# Patient Record
Sex: Female | Born: 1935 | Race: Black or African American | Hispanic: No | Marital: Single | Smoking: Never smoker
Health system: Southern US, Community
[De-identification: ages and names within clinical notes are randomized; demographics above are authoritative.]

## PROBLEM LIST (undated history)

## (undated) DIAGNOSIS — N183 Chronic kidney disease, stage 3 unspecified: Secondary | ICD-10-CM

## (undated) DIAGNOSIS — F0390 Unspecified dementia without behavioral disturbance: Secondary | ICD-10-CM

## (undated) DIAGNOSIS — F1010 Alcohol abuse, uncomplicated: Secondary | ICD-10-CM

## (undated) DIAGNOSIS — H9190 Unspecified hearing loss, unspecified ear: Secondary | ICD-10-CM

## (undated) DIAGNOSIS — E785 Hyperlipidemia, unspecified: Secondary | ICD-10-CM

## (undated) DIAGNOSIS — S32000A Wedge compression fracture of unspecified lumbar vertebra, initial encounter for closed fracture: Secondary | ICD-10-CM

## (undated) DIAGNOSIS — F09 Unspecified mental disorder due to known physiological condition: Secondary | ICD-10-CM

## (undated) DIAGNOSIS — I639 Cerebral infarction, unspecified: Secondary | ICD-10-CM

## (undated) DIAGNOSIS — W19XXXA Unspecified fall, initial encounter: Secondary | ICD-10-CM

## (undated) HISTORY — PX: FEMUR FRACTURE SURGERY: SHX633

## (undated) HISTORY — PX: FRACTURE SURGERY: SHX138

## (undated) HISTORY — PX: OTHER SURGICAL HISTORY: SHX169

---

## 2001-02-23 ENCOUNTER — Other Ambulatory Visit: Admission: RE | Admit: 2001-02-23 | Discharge: 2001-02-23 | Payer: Self-pay | Admitting: Family Medicine

## 2001-03-02 ENCOUNTER — Ambulatory Visit (HOSPITAL_COMMUNITY): Admission: RE | Admit: 2001-03-02 | Discharge: 2001-03-02 | Payer: Self-pay | Admitting: Family Medicine

## 2001-03-02 ENCOUNTER — Encounter: Payer: Self-pay | Admitting: Family Medicine

## 2002-03-16 ENCOUNTER — Encounter: Payer: Self-pay | Admitting: Family Medicine

## 2002-03-16 ENCOUNTER — Ambulatory Visit (HOSPITAL_COMMUNITY): Admission: RE | Admit: 2002-03-16 | Discharge: 2002-03-16 | Payer: Self-pay | Admitting: Family Medicine

## 2003-04-05 ENCOUNTER — Ambulatory Visit (HOSPITAL_COMMUNITY): Admission: RE | Admit: 2003-04-05 | Discharge: 2003-04-05 | Payer: Self-pay | Admitting: Family Medicine

## 2004-12-11 ENCOUNTER — Ambulatory Visit (HOSPITAL_COMMUNITY): Admission: RE | Admit: 2004-12-11 | Discharge: 2004-12-11 | Payer: Self-pay | Admitting: Family Medicine

## 2005-12-16 ENCOUNTER — Ambulatory Visit (HOSPITAL_COMMUNITY): Admission: RE | Admit: 2005-12-16 | Discharge: 2005-12-16 | Payer: Self-pay | Admitting: Family Medicine

## 2006-12-31 ENCOUNTER — Ambulatory Visit (HOSPITAL_COMMUNITY): Admission: RE | Admit: 2006-12-31 | Discharge: 2006-12-31 | Payer: Self-pay | Admitting: Family Medicine

## 2009-12-14 ENCOUNTER — Emergency Department (HOSPITAL_COMMUNITY): Admission: EM | Admit: 2009-12-14 | Discharge: 2009-12-14 | Payer: Self-pay | Admitting: Emergency Medicine

## 2010-05-12 ENCOUNTER — Encounter: Payer: Self-pay | Admitting: Family Medicine

## 2010-05-13 ENCOUNTER — Encounter: Payer: Self-pay | Admitting: Family Medicine

## 2010-07-05 LAB — COMPREHENSIVE METABOLIC PANEL
Alkaline Phosphatase: 72 U/L (ref 39–117)
BUN: 24 mg/dL — ABNORMAL HIGH (ref 6–23)
CO2: 18 mEq/L — ABNORMAL LOW (ref 19–32)
Chloride: 110 mEq/L (ref 96–112)
Creatinine, Ser: 1.58 mg/dL — ABNORMAL HIGH (ref 0.4–1.2)
GFR calc non Af Amer: 31 mL/min — ABNORMAL LOW (ref >60)
Glucose, Bld: 115 mg/dL — ABNORMAL HIGH (ref 70–99)
Potassium: 5 mEq/L (ref 3.5–5.1)
Total Bilirubin: 0.5 mg/dL (ref 0.3–1.2)

## 2010-07-05 LAB — URINALYSIS, ROUTINE W REFLEX MICROSCOPIC
Bilirubin Urine: NEGATIVE
Ketones, ur: NEGATIVE mg/dL
Protein, ur: 100 mg/dL — AB
Urobilinogen, UA: 0.2 mg/dL (ref 0.0–1.0)

## 2010-07-05 LAB — URINE MICROSCOPIC-ADD ON

## 2010-07-05 LAB — CBC
HCT: 33.7 % — ABNORMAL LOW (ref 36.0–46.0)
Hemoglobin: 11.3 g/dL — ABNORMAL LOW (ref 12.0–15.0)
MCH: 29.8 pg (ref 26.0–34.0)
MCV: 88.8 fL (ref 78.0–100.0)
RBC: 3.8 MIL/uL — ABNORMAL LOW (ref 3.87–5.11)

## 2010-07-05 LAB — URINE CULTURE: Colony Count: 35000

## 2010-07-05 LAB — DIFFERENTIAL
Basophils Absolute: 0 10*3/uL (ref 0.0–0.1)
Basophils Relative: 0 % (ref 0–1)
Lymphocytes Relative: 5 % — ABNORMAL LOW (ref 12–46)
Neutro Abs: 9.4 10*3/uL — ABNORMAL HIGH (ref 1.7–7.7)

## 2010-07-05 LAB — PROTIME-INR: Prothrombin Time: 14.8 seconds (ref 11.6–15.2)

## 2010-07-05 LAB — APTT: aPTT: 28 seconds (ref 24–37)

## 2013-08-29 ENCOUNTER — Emergency Department (HOSPITAL_COMMUNITY): Payer: Medicare Other

## 2013-08-29 ENCOUNTER — Inpatient Hospital Stay (HOSPITAL_COMMUNITY)
Admission: EM | Admit: 2013-08-29 | Discharge: 2013-09-01 | DRG: 064 | Disposition: A | Discharge status: Home / Self Care | Payer: Medicare Other | Attending: Family Medicine | Admitting: Family Medicine

## 2013-08-29 ENCOUNTER — Encounter (HOSPITAL_COMMUNITY): Payer: Self-pay | Admitting: Emergency Medicine

## 2013-08-29 DIAGNOSIS — S0003XA Contusion of scalp, initial encounter: Secondary | ICD-10-CM | POA: Diagnosis present

## 2013-08-29 DIAGNOSIS — I6350 Cerebral infarction due to unspecified occlusion or stenosis of unspecified cerebral artery: Principal | ICD-10-CM

## 2013-08-29 DIAGNOSIS — N183 Chronic kidney disease, stage 3 unspecified: Secondary | ICD-10-CM | POA: Diagnosis present

## 2013-08-29 DIAGNOSIS — I639 Cerebral infarction, unspecified: Secondary | ICD-10-CM | POA: Diagnosis present

## 2013-08-29 DIAGNOSIS — Z9181 History of falling: Secondary | ICD-10-CM

## 2013-08-29 DIAGNOSIS — S0083XA Contusion of other part of head, initial encounter: Secondary | ICD-10-CM | POA: Diagnosis present

## 2013-08-29 DIAGNOSIS — S1093XA Contusion of unspecified part of neck, initial encounter: Secondary | ICD-10-CM

## 2013-08-29 DIAGNOSIS — Y92009 Unspecified place in unspecified non-institutional (private) residence as the place of occurrence of the external cause: Secondary | ICD-10-CM

## 2013-08-29 DIAGNOSIS — W06XXXA Fall from bed, initial encounter: Secondary | ICD-10-CM | POA: Diagnosis present

## 2013-08-29 DIAGNOSIS — H9190 Unspecified hearing loss, unspecified ear: Secondary | ICD-10-CM | POA: Diagnosis present

## 2013-08-29 DIAGNOSIS — E785 Hyperlipidemia, unspecified: Secondary | ICD-10-CM | POA: Diagnosis present

## 2013-08-29 DIAGNOSIS — F0280 Dementia in other diseases classified elsewhere without behavioral disturbance: Secondary | ICD-10-CM | POA: Diagnosis present

## 2013-08-29 DIAGNOSIS — F079 Unspecified personality and behavioral disorder due to known physiological condition: Secondary | ICD-10-CM | POA: Diagnosis present

## 2013-08-29 DIAGNOSIS — E43 Unspecified severe protein-calorie malnutrition: Secondary | ICD-10-CM | POA: Diagnosis present

## 2013-08-29 DIAGNOSIS — G309 Alzheimer's disease, unspecified: Secondary | ICD-10-CM | POA: Diagnosis present

## 2013-08-29 DIAGNOSIS — Z681 Body mass index (BMI) 19 or less, adult: Secondary | ICD-10-CM

## 2013-08-29 DIAGNOSIS — I129 Hypertensive chronic kidney disease with stage 1 through stage 4 chronic kidney disease, or unspecified chronic kidney disease: Secondary | ICD-10-CM | POA: Diagnosis present

## 2013-08-29 HISTORY — DX: Unspecified hearing loss, unspecified ear: H91.90

## 2013-08-29 HISTORY — DX: Unspecified mental disorder due to known physiological condition: F09

## 2013-08-29 LAB — CBC WITH DIFFERENTIAL/PLATELET
BASOS PCT: 0 % (ref 0–1)
Basophils Absolute: 0 10*3/uL (ref 0.0–0.1)
EOS ABS: 0 10*3/uL (ref 0.0–0.7)
Eosinophils Relative: 0 % (ref 0–5)
HEMATOCRIT: 30.2 % — AB (ref 36.0–46.0)
HEMOGLOBIN: 10 g/dL — AB (ref 12.0–15.0)
Lymphocytes Relative: 13 % (ref 12–46)
Lymphs Abs: 0.8 10*3/uL (ref 0.7–4.0)
MCH: 29.9 pg (ref 26.0–34.0)
MCHC: 33.1 g/dL (ref 30.0–36.0)
MCV: 90.4 fL (ref 78.0–100.0)
MONO ABS: 0.5 10*3/uL (ref 0.1–1.0)
MONOS PCT: 9 % (ref 3–12)
NEUTROS PCT: 78 % — AB (ref 43–77)
Neutro Abs: 4.7 10*3/uL (ref 1.7–7.7)
Platelets: 148 10*3/uL — ABNORMAL LOW (ref 150–400)
RBC: 3.34 MIL/uL — ABNORMAL LOW (ref 3.87–5.11)
RDW: 14.6 % (ref 11.5–15.5)
WBC: 6 10*3/uL (ref 4.0–10.5)

## 2013-08-29 LAB — BASIC METABOLIC PANEL
BUN: 25 mg/dL — AB (ref 6–23)
CALCIUM: 8.6 mg/dL (ref 8.4–10.5)
CO2: 22 mEq/L (ref 19–32)
CREATININE: 1.38 mg/dL — AB (ref 0.50–1.10)
Chloride: 108 mEq/L (ref 96–112)
GFR, EST AFRICAN AMERICAN: 36 mL/min — AB (ref >90)
GFR, EST NON AFRICAN AMERICAN: 31 mL/min — AB (ref >90)
Glucose, Bld: 118 mg/dL — ABNORMAL HIGH (ref 70–99)
Potassium: 4.9 mEq/L (ref 3.7–5.3)
Sodium: 142 mEq/L (ref 137–147)

## 2013-08-29 MED ORDER — ASPIRIN 300 MG RE SUPP
300.0000 mg | Freq: Every day | RECTAL | Status: DC
  Filled 2013-08-29 (×5): qty 1

## 2013-08-29 MED ORDER — THIORIDAZINE HCL 10 MG PO TABS
10.0000 mg | ORAL_TABLET | Freq: Two times a day (BID) | ORAL | Status: DC
  Administered 2013-08-30 – 2013-09-01 (×6): 10 mg via ORAL
  Filled 2013-08-29 (×8): qty 1

## 2013-08-29 MED ORDER — THIORIDAZINE HCL 10 MG PO TABS
ORAL_TABLET | ORAL | Status: AC
  Filled 2013-08-29: qty 1

## 2013-08-29 MED ORDER — ENOXAPARIN SODIUM 40 MG/0.4ML ~~LOC~~ SOLN
40.0000 mg | SUBCUTANEOUS | Status: DC
  Administered 2013-08-30 – 2013-08-31 (×2): 40 mg via SUBCUTANEOUS
  Filled 2013-08-29 (×3): qty 0.4

## 2013-08-29 MED ORDER — SENNOSIDES-DOCUSATE SODIUM 8.6-50 MG PO TABS
1.0000 | ORAL_TABLET | Freq: Every evening | ORAL | Status: DC | PRN
  Filled 2013-08-29: qty 1

## 2013-08-29 MED ORDER — ASPIRIN 325 MG PO TABS
325.0000 mg | ORAL_TABLET | Freq: Every day | ORAL | Status: DC
  Administered 2013-08-30 – 2013-09-01 (×4): 325 mg via ORAL
  Filled 2013-08-29 (×4): qty 1

## 2013-08-29 MED ORDER — SIMVASTATIN 20 MG PO TABS
20.0000 mg | ORAL_TABLET | Freq: Every evening | ORAL | Status: DC
  Administered 2013-08-30 – 2013-08-31 (×3): 20 mg via ORAL
  Filled 2013-08-29 (×5): qty 1

## 2013-08-29 NOTE — ED Provider Notes (Signed)
CSN: [id number]     Arrival date & time 08/29/13  1445 History   First MD Initiated Contact with Patient 08/29/13 1516     Chief Complaint  Patient presents with  . Fall     (Consider location/radiation/quality/duration/timing/severity/associated sxs/prior Treatment) HPI Comments: Brought to the ER for evaluation secondary to fall. Patient reportedly fell out of bed this morning. She is noted to have some bruising on the left side of her face. Able to get her up and she did ambulate after the fall. Because of this, she is now brought in for evaluation. She went for a nap this afternoon, however, and upon waking up seems to be having difficulty bearing weight on her left side. Upon arrival, patient does say she has some pain going down her left leg, but cannot pinpoint it. She cannot provide much more information than that. Level V Caveat due to dementia.  Patient is a 80 y.o. female presenting with fall.  Fall    Past Medical History  Diagnosis Date  . Organic brain syndrome   . Hard of hearing    Past Surgical History  Procedure Laterality Date  . Chronic alcohol abuse    . Fracture surgery    . Femur fracture surgery     History reviewed. No pertinent family history. History  Substance Use Topics  . Smoking status: Never Smoker   . Smokeless tobacco: Not on file  . Alcohol Use: No   OB History   Grav Para Term Preterm Abortions TAB SAB Ect Mult Living                 Review of Systems  Unable to perform ROS     Allergies  Review of patient's allergies indicates no known allergies.  Home Medications   Prior to Admission medications   Medication Sig Start Date End Date Taking? Authorizing Provider  docusate sodium (COLACE) 100 MG capsule Take 100 mg by mouth at bedtime.   Yes Historical Provider, MD  ENSURE (ENSURE) Take 237 mLs by mouth daily.   Yes Historical Provider, MD  simvastatin (ZOCOR) 20 MG tablet Take 20 mg by mouth every evening.   Yes Historical  Provider, MD  thioridazine (MELLARIL) 10 MG tablet Take 10 mg by mouth 2 (two) times daily.   Yes Historical Provider, MD   BP 180/96  Pulse 70  Temp(Src) 98.3 F (36.8 C) (Oral)  Resp 16  SpO2 100% Physical Exam  Constitutional: She is oriented to person, place, and time. She appears well-developed and well-nourished. No distress.  HENT:  Head: Normocephalic. Head is with contusion.    Right Ear: Hearing normal.  Left Ear: Hearing normal.  Nose: Nose normal.  Mouth/Throat: Oropharynx is clear and moist and mucous membranes are normal.  Eyes: Conjunctivae and EOM are normal. Pupils are equal, round, and reactive to light.  Neck: Normal range of motion. Neck supple.  Cardiovascular: Regular rhythm, S1 normal and S2 normal.  Exam reveals no gallop and no friction rub.   No murmur heard. Pulmonary/Chest: Effort normal and breath sounds normal. No respiratory distress. She exhibits no tenderness.  Abdominal: Soft. Normal appearance and bowel sounds are normal. There is no hepatosplenomegaly. There is no tenderness. There is no rebound, no guarding, no tenderness at McBurney's point and negative Murphy's sign. No hernia.  Musculoskeletal: Normal range of motion.       Right hip: She exhibits normal range of motion.       Left hip: She exhibits  normal range of motion.  Neurological: She is alert and oriented to person, place, and time. She has normal strength. No cranial nerve deficit or sensory deficit. Coordination normal. GCS eye subscore is 4. GCS verbal subscore is 5. GCS motor subscore is 6.  Skin: Skin is warm, dry and intact. No rash noted. No cyanosis.  Psychiatric: She has a normal mood and affect. Her speech is normal and behavior is normal. Thought content normal.    ED Course  Procedures (including critical care time) Labs Review Labs Reviewed  CBC WITH DIFFERENTIAL - Abnormal; Notable for the following:    RBC 3.34 (*)    Hemoglobin 10.0 (*)    HCT 30.2 (*)     Platelets 148 (*)    Neutrophils Relative % 78 (*)    All other components within normal limits  BASIC METABOLIC PANEL - Abnormal; Notable for the following:    Glucose, Bld 118 (*)    BUN 25 (*)    Creatinine, Ser 1.38 (*)    GFR calc non Af Amer 31 (*)    GFR calc Af Amer 36 (*)    All other components within normal limits    Imaging Review Dg Thoracic Spine 2 View  08/29/2013   CLINICAL DATA:  Status post fall  EXAM: THORACIC SPINE - 2 VIEW  COMPARISON:  None.  FINDINGS: The study is limited due to patient's inability to cooperate with positioning. The thoracic vertebral bodies appear grossly normal in height with the exception of L1 clear compression is suspected with loss of height of approximately 50%. No definite abnormal paravertebral soft tissue densities are demonstrated.  IMPRESSION: This is a very limited study. There may be partial compression of or L1. This is evaluated better on the accompanying lumbar spine series.   Electronically Signed   By: David  [place]   On: 08/29/2013 18:02   Dg Lumbar Spine Complete  08/29/2013   CLINICAL DATA:  Status post fall now with back pain  EXAM: LUMBAR SPINE - COMPLETE 4+ VIEW  COMPARISON:  None.  FINDINGS: The images are limited by large amounts of overlying stool and gas. The patient has sustained compressions of the bodies of L1 and L2 and L3. The L1 vertebral body exhibits loss of height of approximately 60%. The L2 vertebral body exhibits loss of height of approximately 50%. The L3 vertebral body exhibits loss of height of nearly 90%. No definite retropulsed fragments are demonstrated. The bones are diffusely osteopenic.  IMPRESSION: There are compression fractures involving L1 through L3 as described. There are no previous studies with which to compare.   Electronically Signed   By: David  [place]   On: 08/29/2013 18:13   Dg Hip Bilateral W/pelvis  08/29/2013   CLINICAL DATA:  Status post fall  EXAM: BILATERAL HIP WITH PELVIS - 4+ VIEW   COMPARISON:  None.  FINDINGS: The bony pelvis is osteopenic. Stool and gas obscures portions of the iliac bones. There is symmetric narrowing of both hip joints. The patient has undergone previous ORIF for a midshaft femoral fracture on the left. No acute fractures demonstrated. There is dense vascular arterial calcification bilaterally. The right hip and proximal and midportions of the right femur appear intact. The knees are not included in the field of view.  IMPRESSION: There is no evidence of an acute pelvic or hip fracture.   Electronically Signed   By: David  [place]   On: 08/29/2013 18:11   Ct Head Wo Contrast  08/29/2013  CLINICAL DATA:  Larey Seat when trying to get out of bed. Hit left side of face on a door.  EXAM: CT HEAD WITHOUT CONTRAST  CT MAXILLOFACIAL WITHOUT CONTRAST  CT CERVICAL SPINE WITHOUT CONTRAST  TECHNIQUE: Multidetector CT imaging of the head, cervical spine, and maxillofacial structures were performed using the standard protocol without intravenous contrast. Multiplanar CT image reconstructions of the cervical spine and maxillofacial structures were also generated.  COMPARISON:  CT head from 12/14/2009.  FINDINGS: CT HEAD FINDINGS  There is a focal area of low attenuation in the inferior left cerebellum, suggesting subacute ischemia. No evidence for acute hemorrhage or hydrocephalus. No abnormal extra-axial fluid collection. No mass lesion is evident. Diffuse loss of parenchymal volume is consistent with atrophy. 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  The visualized paranasal sinuses and mastoid air cells are clear. Fluid in the left mastoid air cells is a stable finding.  CT MAXILLOFACIAL FINDINGS  The mandible is intact with motion artifact through the symphyseal region. . The temporomandibular joints are located. There is degenerative change in the right  temporomandibular joint. Nasal bones are intact. No zygomatic arch fracture. No maxillary sinus fracture. The medial and inferior orbital walls are intact bilaterally. No air-fluid levels in the paranasal sinuses.  Soft tissue swelling/contusion noted over the left cheek.  The globes are symmetric in size and shape. Intraorbital fat is preserved bilaterally.  CT CERVICAL SPINE FINDINGS  Imaging was obtained from the skullbase through the T2 vertebral body. Cervical lordosis is accentuated. No evidence for fracture. No subluxation. There is diffuse degenerative disc and facet disease in the cervical spine. No prevertebral soft tissue swelling.  9 mm right thyroid nodule is evident. There is asymmetry of the piriform sinuses with soft tissue filling the expected location of the left piriform sinus asymmetrically.  IMPRESSION: Focal low-attenuation in the inferior left cerebellum compatible with acute to subacute infarct. MRI would be helpful to further evaluate.  No evidence for an acute facial bone fracture.  Asymmetric fullness in the region of the left piriform sinus. As neoplasm can present with similar imaging features, consider direct inspection or further evaluation with CT or MRI performed with intravenous contrast material. This followup imaging of the neck could be performed as an outpatient procedure after resolution of acute symptoms and when patient is better able to cooperate with positioning and breath holding.   Electronically Signed   By: Kennith Center M.D.   On: 08/29/2013 17:26   Ct Cervical Spine Wo Contrast  08/29/2013   CLINICAL DATA:  Larey Seat when trying to get out of bed. Hit left side of face on a door.  EXAM: CT HEAD WITHOUT CONTRAST  CT MAXILLOFACIAL WITHOUT CONTRAST  CT CERVICAL SPINE WITHOUT CONTRAST  TECHNIQUE: Multidetector CT imaging of the head, cervical spine, and maxillofacial structures were performed using the standard protocol without intravenous contrast. Multiplanar CT image  reconstructions of the cervical spine and maxillofacial structures were also generated.  COMPARISON:  CT head from 12/14/2009.  FINDINGS: CT HEAD FINDINGS  There is a focal area of low attenuation in the inferior left cerebellum, suggesting subacute ischemia. No evidence for acute hemorrhage or hydrocephalus. No abnormal extra-axial fluid collection. No mass lesion is evident. Diffuse loss of parenchymal volume is consistent with atrophy. 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  The visualized paranasal sinuses  and mastoid air cells are clear. Fluid in the left mastoid air cells is a stable finding.  CT MAXILLOFACIAL FINDINGS  The mandible is intact with motion artifact through the symphyseal region. . The temporomandibular joints are located. There is degenerative change in the right temporomandibular joint. Nasal bones are intact. No zygomatic arch fracture. No maxillary sinus fracture. The medial and inferior orbital walls are intact bilaterally. No air-fluid levels in the paranasal sinuses.  Soft tissue swelling/contusion noted over the left cheek.  The globes are symmetric in size and shape. Intraorbital fat is preserved bilaterally.  CT CERVICAL SPINE FINDINGS  Imaging was obtained from the skullbase through the T2 vertebral body. Cervical lordosis is accentuated. No evidence for fracture. No subluxation. There is diffuse degenerative disc and facet disease in the cervical spine. No prevertebral soft tissue swelling.  9 mm right thyroid nodule is evident. There is asymmetry of the piriform sinuses with soft tissue filling the expected location of the left piriform sinus asymmetrically.  IMPRESSION: Focal low-attenuation in the inferior left cerebellum compatible with acute to subacute infarct. MRI would be helpful to further evaluate.  No evidence for an acute facial bone fracture.   Asymmetric fullness in the region of the left piriform sinus. As neoplasm can present with similar imaging features, consider direct inspection or further evaluation with CT or MRI performed with intravenous contrast material. This followup imaging of the neck could be performed as an outpatient procedure after resolution of acute symptoms and when patient is better able to cooperate with positioning and breath holding.   Electronically Signed   By: Kennith Center M.D.   On: 08/29/2013 17:26   Ct Lumbar Spine Wo Contrast  08/29/2013   CLINICAL DATA:  Fall.  Low back pain.  Facial bruising.  EXAM: CT LUMBAR SPINE WITHOUT CONTRAST  TECHNIQUE: Multidetector CT imaging of the lumbar spine was performed without intravenous contrast administration. Multiplanar CT image reconstructions were also generated.  COMPARISON:  None.  FINDINGS: There is severe bony demineralization along with a 60% compression fracture of L1, a 50% compression fracture of L2, and a 70% compression fracture of L3, age at which are probably chronic. Each of these has associated posterior bony retropulsion, amounting to 6 mm at L1, 9 mm at L2, and 7 mm at L3. These compression fractures are likely chronic and centrally at the L3 compression fracture there is near complete loss of vertebral body height. Prominent bony demineralization is present diffusely. Multilevel facet arthropathy is observed.  Trace left pleural effusion. Hypodense lesion of the right kidney upper pole, technically nonspecific. Aortoiliac atherosclerotic vascular disease.  Additional findings at individual levels are as follows:  T12-L1:  No impingement identified.  L1-2: Moderate to prominent left and mild right foraminal stenosis due to spurring and bony retropulsion. Mild central narrowing of the thecal sac  L2-3:  Diffuse disc bulge without overt impingement.  L3-4: Moderate to prominent bilateral foraminal stenosis and moderate central narrowing of the thecal sac due to  facet arthropathy, spurring, and posterior bony retropulsion.  L4-5: Moderate right and mild left foraminal stenosis due to facet arthropathy and disc bulge.  L5-S1: No impingement. 4 mm grade 1 anterolisthesis, degenerative in nature.  IMPRESSION: 1. Chronic compression fractures at L1, L2, and L3. 2. Lumbar spondylosis, degenerative disc disease, and chronic bony retropulsion contribute to impingement at L1-2, L3-4, and L4-5 as detailed above. 3. Severe bony demineralization. Appearance compatible with osteoporosis. 4. Trace left pleural effusion. 5. Hypodense right kidney upper  pole lesion, probably a cyst but technically nonspecific.   Electronically Signed   By: Herbie Baltimore M.D.   On: 08/29/2013 19:33   Ct Maxillofacial Wo Cm  08/29/2013   CLINICAL DATA:  Larey Seat when trying to get out of bed. Hit left side of face on a door.  EXAM: CT HEAD WITHOUT CONTRAST  CT MAXILLOFACIAL WITHOUT CONTRAST  CT CERVICAL SPINE WITHOUT CONTRAST  TECHNIQUE: Multidetector CT imaging of the head, cervical spine, and maxillofacial structures were performed using the standard protocol without intravenous contrast. Multiplanar CT image reconstructions of the cervical spine and maxillofacial structures were also generated.  COMPARISON:  CT head from 12/14/2009.  FINDINGS: CT HEAD FINDINGS  There is a focal area of low attenuation in the inferior left cerebellum, suggesting subacute ischemia. No evidence for acute hemorrhage or hydrocephalus. No abnormal extra-axial fluid collection. No mass lesion is evident. Diffuse loss of parenchymal volume is consistent with atrophy. 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  The visualized paranasal sinuses and mastoid air cells are clear. Fluid in the left mastoid air cells is a stable finding.  CT MAXILLOFACIAL FINDINGS  The mandible is intact with motion artifact through  the symphyseal region. . The temporomandibular joints are located. There is degenerative change in the right temporomandibular joint. Nasal bones are intact. No zygomatic arch fracture. No maxillary sinus fracture. The medial and inferior orbital walls are intact bilaterally. No air-fluid levels in the paranasal sinuses.  Soft tissue swelling/contusion noted over the left cheek.  The globes are symmetric in size and shape. Intraorbital fat is preserved bilaterally.  CT CERVICAL SPINE FINDINGS  Imaging was obtained from the skullbase through the T2 vertebral body. Cervical lordosis is accentuated. No evidence for fracture. No subluxation. There is diffuse degenerative disc and facet disease in the cervical spine. No prevertebral soft tissue swelling.  9 mm right thyroid nodule is evident. There is asymmetry of the piriform sinuses with soft tissue filling the expected location of the left piriform sinus asymmetrically.  IMPRESSION: Focal low-attenuation in the inferior left cerebellum compatible with acute to subacute infarct. MRI would be helpful to further evaluate.  No evidence for an acute facial bone fracture.  Asymmetric fullness in the region of the left piriform sinus. As neoplasm can present with similar imaging features, consider direct inspection or further evaluation with CT or MRI performed with intravenous contrast material. This followup imaging of the neck could be performed as an outpatient procedure after resolution of acute symptoms and when patient is better able to cooperate with positioning and breath holding.   Electronically Signed   By: Kennith Center M.D.   On: 08/29/2013 17:26     EKG Interpretation None      MDM   Final diagnoses:  CVA (cerebral vascular accident)   Patient presents to the ER for evaluation after a fall. Patient reportedly fell trying to get out of bed earlier, but it was an unwitnessed fall. She has bruising to the left side of her face. She was found lying on  the left side. Initially, she was able to get up and ambulate. Leaving today, however, she was noted to have difficulty with ambulation, syncope not wanting to bear weight on her left side. She did not have any significant deformity or decreased range of motion at the hips. X-rays of spine and hips with pelvis was performed. No acute abnormality. CT scan of the  head and cervical spine were performed. No injury from the fall was noted, but she does have findings consistent with acute or subacute infarct. This might explain the patient's difficulty with ambulation, but she does not have any focal findings on exam. Patient will therefore be admitted to the hospital for further management and workup.    Gilda Creasehristopher J. Pollina, MD 08/29/13 2125

## 2013-08-29 NOTE — ED Notes (Signed)
Pt resting in bed- nad noted. Report given to J. Armstrong.

## 2013-08-29 NOTE — Progress Notes (Signed)
Patient's nursing history limited d/t dementia. No family or friends at bedside.

## 2013-08-29 NOTE — ED Notes (Signed)
[facility] [place] [facility].  Larey SeatFell when trying to get OOB, struck lt side of face on bed board.  Contusion present.  Later would not walk as usual.  Drowsy at triage.

## 2013-08-29 NOTE — H&P (Signed)
PCP:   Lanette Hampshire, MD   Chief Complaint:  Fall  HPI:  80 year old [name] who  has a past medical history of Organic brain syndrome and Hard of hearing. patient resides at group home and was brought to the ED after patient fell out of bed this morning. Patient had bruise on the left side of the face patient was able to get up and ambulatory to the fall. In the afternoon after patient had an active when she woke up she seemed to have difficulty being weight on the left side. So she was brought to the hospital for further evaluation. Patient is a very poor historian, is not oriented x3, has dementia,  unable to provide any significant history. She denies any pain at this time.  In the ED all the imaging studies done did not show significant abnormality except CT head which showed focal low attenuation in the inferior left cerebellum compatible with acute to subacute infarct.  Allergies:  No Known Allergies    Past Medical History  Diagnosis Date  . Organic brain syndrome   . Hard of hearing     Past Surgical History  Procedure Laterality Date  . Chronic alcohol abuse    . Fracture surgery    . Femur fracture surgery      Prior to Admission medications   Medication Sig Start Date End Date Taking? Authorizing Provider  docusate sodium (COLACE) 100 MG capsule Take 100 mg by mouth at bedtime.   Yes Historical Provider, MD  ENSURE (ENSURE) Take 237 mLs by mouth daily.   Yes Historical Provider, MD  simvastatin (ZOCOR) 20 MG tablet Take 20 mg by mouth every evening.   Yes Historical Provider, MD  thioridazine (MELLARIL) 10 MG tablet Take 10 mg by mouth 2 (two) times daily.   Yes Historical Provider, MD    Social History:  reports that she has never smoked. She does not have any smokeless tobacco history on file. She reports that she does not drink alcohol or use illicit drugs.  History reviewed. No pertinent family history.     Review of Systems:  As in the history of  present illness, rest of review of systems unobtainable at this time the patient's dementia   Physical Exam: Blood pressure 180/96, pulse 70, temperature 98.3 F (36.8 C), temperature source Oral, resp. rate 16, SpO2 100.00%. Constitutional:   Patient is a well-developed and well-nourished [name]* in no acute distress and not cooperative with exam. Head: Normocephalic and atraumatic Mouth: Mucus membranes moist Eyes: PERRL, EOMI, conjunctivae normal Cardiovascular: RRR, S1 normal, S2 normal Pulmonary/Chest: CTAB, no wheezes, rales, or rhonchi Abdominal: Soft. Non-tender, non-distended,  no masses, organomegaly, or guarding present.  Neurological: Alert, not oriented x3, moving all extremities the rest of neurological examination could not be done as patient was not recuperative with examination  Extremities : No Cyanosis, Clubbing or Edema   Labs on Admission:  Results for orders placed during the hospital encounter of 08/29/13 (from the past Kendra hour(s))  CBC WITH DIFFERENTIAL     Status: Abnormal   Collection Time    08/29/13  6:40 PM      Result Value Ref Range   WBC 6.0  4.0 - 10.5 K/uL   RBC 3.34 (*) 3.87 - 5.11 MIL/uL   Hemoglobin 10.0 (*) 12.0 - 15.0 g/dL   HCT 30.2 (*) 36.0 - 46.0 %   MCV 90.4  78.0 - 100.0 fL   MCH 29.9  26.0 - 34.0 pg  MCHC 33.1  30.0 - 36.0 g/dL   RDW 14.6  11.5 - 15.5 %   Platelets 148 (*) 150 - 400 K/uL   Neutrophils Relative % 78 (*) 43 - 77 %   Neutro Abs 4.7  1.7 - 7.7 K/uL   Lymphocytes Relative 13  12 - 46 %   Lymphs Abs 0.8  0.7 - 4.0 K/uL   Monocytes Relative 9  3 - 12 %   Monocytes Absolute 0.5  0.1 - 1.0 K/uL   Eosinophils Relative 0  0 - 5 %   Eosinophils Absolute 0.0  0.0 - 0.7 K/uL   Basophils Relative 0  0 - 1 %   Basophils Absolute 0.0  0.0 - 0.1 K/uL  BASIC METABOLIC PANEL     Status: Abnormal   Collection Time    08/29/13  6:40 PM      Result Value Ref Range   Sodium 142  137 - 147 mEq/L   Potassium 4.9  3.7 - 5.3 mEq/L    Chloride 108  96 - 112 mEq/L   CO2 22  19 - 32 mEq/L   Glucose, Bld 118 (*) 70 - 99 mg/dL   BUN 25 (*) 6 - 23 mg/dL   Creatinine, Ser 1.38 (*) 0.50 - 1.10 mg/dL   Calcium 8.6  8.4 - 10.5 mg/dL   GFR calc non Af Amer 31 (*) >90 mL/min   GFR calc Af Amer 36 (*) >90 mL/min   Comment: (NOTE)     The eGFR has been calculated using the CKD EPI equation.     This calculation has not been validated in all clinical situations.     eGFR's persistently <90 mL/min signify possible Chronic Kidney     Disease.    Radiological Exams on Admission: Dg Thoracic Spine 2 View  08/29/2013   CLINICAL DATA:  Status post fall  EXAM: THORACIC SPINE - 2 VIEW  COMPARISON:  None.  FINDINGS: The study is limited due to patient's inability to cooperate with positioning. The thoracic vertebral bodies appear grossly normal in height with the exception of L1 clear compression is suspected with loss of height of approximately 50%. No definite abnormal paravertebral soft tissue densities are demonstrated.  IMPRESSION: This is a very limited study. There may be partial compression of or L1. This is evaluated better on the accompanying lumbar spine series.   Electronically Signed   By: David  [place]   On: 08/29/2013 18:02   Dg Lumbar Spine Complete  08/29/2013   CLINICAL DATA:  Status post fall now with back pain  EXAM: LUMBAR SPINE - COMPLETE 4+ VIEW  COMPARISON:  None.  FINDINGS: The images are limited by large amounts of overlying stool and gas. The patient has sustained compressions of the bodies of L1 and L2 and L3. The L1 vertebral body exhibits loss of height of approximately 60%. The L2 vertebral body exhibits loss of height of approximately 50%. The L3 vertebral body exhibits loss of height of nearly 90%. No definite retropulsed fragments are demonstrated. The bones are diffusely osteopenic.  IMPRESSION: There are compression fractures involving L1 through L3 as described. There are no previous studies with which to  compare.   Electronically Signed   By: David  [place]   On: 08/29/2013 18:13   Dg Hip Bilateral W/pelvis  08/29/2013   CLINICAL DATA:  Status post fall  EXAM: BILATERAL HIP WITH PELVIS - 4+ VIEW  COMPARISON:  None.  FINDINGS: The bony pelvis is osteopenic.  Stool and gas obscures portions of the iliac bones. There is symmetric narrowing of both hip joints. The patient has undergone previous ORIF for a midshaft femoral fracture on the left. No acute fractures demonstrated. There is dense vascular arterial calcification bilaterally. The right hip and proximal and midportions of the right femur appear intact. The knees are not included in the field of view.  IMPRESSION: There is no evidence of an acute pelvic or hip fracture.   Electronically Signed   By: David  [place]   On: 08/29/2013 18:11   Ct Head Wo Contrast  08/29/2013   CLINICAL DATA:  Larey Seat when trying to get out of bed. Hit left side of face on a door.  EXAM: CT HEAD WITHOUT CONTRAST  CT MAXILLOFACIAL WITHOUT CONTRAST  CT CERVICAL SPINE WITHOUT CONTRAST  TECHNIQUE: Multidetector CT imaging of the head, cervical spine, and maxillofacial structures were performed using the standard protocol without intravenous contrast. Multiplanar CT image reconstructions of the cervical spine and maxillofacial structures were also generated.  COMPARISON:  CT head from 12/14/2009.  FINDINGS: CT HEAD FINDINGS  There is a focal area of low attenuation in the inferior left cerebellum, suggesting subacute ischemia. No evidence for acute hemorrhage or hydrocephalus. No abnormal extra-axial fluid collection. No mass lesion is evident. Diffuse loss of parenchymal volume is consistent with atrophy. 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  The visualized paranasal sinuses and mastoid air cells are clear. Fluid in the left mastoid air cells is a stable finding.  CT  MAXILLOFACIAL FINDINGS  The mandible is intact with motion artifact through the symphyseal region. . The temporomandibular joints are located. There is degenerative change in the right temporomandibular joint. Nasal bones are intact. No zygomatic arch fracture. No maxillary sinus fracture. The medial and inferior orbital walls are intact bilaterally. No air-fluid levels in the paranasal sinuses.  Soft tissue swelling/contusion noted over the left cheek.  The globes are symmetric in size and shape. Intraorbital fat is preserved bilaterally.  CT CERVICAL SPINE FINDINGS  Imaging was obtained from the skullbase through the T2 vertebral body. Cervical lordosis is accentuated. No evidence for fracture. No subluxation. There is diffuse degenerative disc and facet disease in the cervical spine. No prevertebral soft tissue swelling.  9 mm right thyroid nodule is evident. There is asymmetry of the piriform sinuses with soft tissue filling the expected location of the left piriform sinus asymmetrically.  IMPRESSION: Focal low-attenuation in the inferior left cerebellum compatible with acute to subacute infarct. MRI would be helpful to further evaluate.  No evidence for an acute facial bone fracture.  Asymmetric fullness in the region of the left piriform sinus. As neoplasm can present with similar imaging features, consider direct inspection or further evaluation with CT or MRI performed with intravenous contrast material. This followup imaging of the neck could be performed as an outpatient procedure after resolution of acute symptoms and when patient is better able to cooperate with positioning and breath holding.   Electronically Signed   By: Kennith Center M.D.   On: 08/29/2013 17:26   Ct Cervical Spine Wo Contrast  08/29/2013   CLINICAL DATA:  Larey Seat when trying to get out of bed. Hit left side of face on a door.  EXAM: CT HEAD WITHOUT CONTRAST  CT MAXILLOFACIAL WITHOUT CONTRAST  CT CERVICAL SPINE WITHOUT CONTRAST   TECHNIQUE: Multidetector CT imaging of the head, cervical spine, and maxillofacial structures  were performed using the standard protocol without intravenous contrast. Multiplanar CT image reconstructions of the cervical spine and maxillofacial structures were also generated.  COMPARISON:  CT head from 12/14/2009.  FINDINGS: CT HEAD FINDINGS  There is a focal area of low attenuation in the inferior left cerebellum, suggesting subacute ischemia. No evidence for acute hemorrhage or hydrocephalus. No abnormal extra-axial fluid collection. No mass lesion is evident. Diffuse loss of parenchymal volume is consistent with atrophy. 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  The visualized paranasal sinuses and mastoid air cells are clear. Fluid in the left mastoid air cells is a stable finding.  CT MAXILLOFACIAL FINDINGS  The mandible is intact with motion artifact through the symphyseal region. . The temporomandibular joints are located. There is degenerative change in the right temporomandibular joint. Nasal bones are intact. No zygomatic arch fracture. No maxillary sinus fracture. The medial and inferior orbital walls are intact bilaterally. No air-fluid levels in the paranasal sinuses.  Soft tissue swelling/contusion noted over the left cheek.  The globes are symmetric in size and shape. Intraorbital fat is preserved bilaterally.  CT CERVICAL SPINE FINDINGS  Imaging was obtained from the skullbase through the T2 vertebral body. Cervical lordosis is accentuated. No evidence for fracture. No subluxation. There is diffuse degenerative disc and facet disease in the cervical spine. No prevertebral soft tissue swelling.  9 mm right thyroid nodule is evident. There is asymmetry of the piriform sinuses with soft tissue filling the expected location of the left piriform sinus asymmetrically.  IMPRESSION: Focal  low-attenuation in the inferior left cerebellum compatible with acute to subacute infarct. MRI would be helpful to further evaluate.  No evidence for an acute facial bone fracture.  Asymmetric fullness in the region of the left piriform sinus. As neoplasm can present with similar imaging features, consider direct inspection or further evaluation with CT or MRI performed with intravenous contrast material. This followup imaging of the neck could be performed as an outpatient procedure after resolution of acute symptoms and when patient is better able to cooperate with positioning and breath holding.   Electronically Signed   By: Kennith Center M.D.   On: 08/29/2013 17:26   Ct Lumbar Spine Wo Contrast  08/29/2013   CLINICAL DATA:  Fall.  Low back pain.  Facial bruising.  EXAM: CT LUMBAR SPINE WITHOUT CONTRAST  TECHNIQUE: Multidetector CT imaging of the lumbar spine was performed without intravenous contrast administration. Multiplanar CT image reconstructions were also generated.  COMPARISON:  None.  FINDINGS: There is severe bony demineralization along with a 60% compression fracture of L1, a 50% compression fracture of L2, and a 70% compression fracture of L3, age at which are probably chronic. Each of these has associated posterior bony retropulsion, amounting to 6 mm at L1, 9 mm at L2, and 7 mm at L3. These compression fractures are likely chronic and centrally at the L3 compression fracture there is near complete loss of vertebral body height. Prominent bony demineralization is present diffusely. Multilevel facet arthropathy is observed.  Trace left pleural effusion. Hypodense lesion of the right kidney upper pole, technically nonspecific. Aortoiliac atherosclerotic vascular disease.  Additional findings at individual levels are as follows:  T12-L1:  No impingement identified.  L1-2: Moderate to prominent left and mild right foraminal stenosis due to spurring and bony retropulsion. Mild central narrowing of the  thecal sac  L2-3:  Diffuse disc bulge without overt impingement.  L3-4: Moderate to prominent bilateral foraminal stenosis and moderate central narrowing of the thecal sac due to facet arthropathy, spurring, and posterior bony retropulsion.  L4-5: Moderate right and mild left foraminal stenosis due to facet arthropathy and disc bulge.  L5-S1: No impingement. 4 mm grade 1 anterolisthesis, degenerative in nature.  IMPRESSION: 1. Chronic compression fractures at L1, L2, and L3. 2. Lumbar spondylosis, degenerative disc disease, and chronic bony retropulsion contribute to impingement at L1-2, L3-4, and L4-5 as detailed above. 3. Severe bony demineralization. Appearance compatible with osteoporosis. 4. Trace left pleural effusion. 5. Hypodense right kidney upper pole lesion, probably a cyst but technically nonspecific.   Electronically Signed   By: Herbie Baltimore M.D.   On: 08/29/2013 19:33   Ct Maxillofacial Wo Cm  08/29/2013   CLINICAL DATA:  Larey Seat when trying to get out of bed. Hit left side of face on a door.  EXAM: CT HEAD WITHOUT CONTRAST  CT MAXILLOFACIAL WITHOUT CONTRAST  CT CERVICAL SPINE WITHOUT CONTRAST  TECHNIQUE: Multidetector CT imaging of the head, cervical spine, and maxillofacial structures were performed using the standard protocol without intravenous contrast. Multiplanar CT image reconstructions of the cervical spine and maxillofacial structures were also generated.  COMPARISON:  CT head from 12/14/2009.  FINDINGS: CT HEAD FINDINGS  There is a focal area of low attenuation in the inferior left cerebellum, suggesting subacute ischemia. No evidence for acute hemorrhage or hydrocephalus. No abnormal extra-axial fluid collection. No mass lesion is evident. Diffuse loss of parenchymal volume is consistent with atrophy. 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  The visualized paranasal  sinuses and mastoid air cells are clear. Fluid in the left mastoid air cells is a stable finding.  CT MAXILLOFACIAL FINDINGS  The mandible is intact with motion artifact through the symphyseal region. . The temporomandibular joints are located. There is degenerative change in the right temporomandibular joint. Nasal bones are intact. No zygomatic arch fracture. No maxillary sinus fracture. The medial and inferior orbital walls are intact bilaterally. No air-fluid levels in the paranasal sinuses.  Soft tissue swelling/contusion noted over the left cheek.  The globes are symmetric in size and shape. Intraorbital fat is preserved bilaterally.  CT CERVICAL SPINE FINDINGS  Imaging was obtained from the skullbase through the T2 vertebral body. Cervical lordosis is accentuated. No evidence for fracture. No subluxation. There is diffuse degenerative disc and facet disease in the cervical spine. No prevertebral soft tissue swelling.  9 mm right thyroid nodule is evident. There is asymmetry of the piriform sinuses with soft tissue filling the expected location of the left piriform sinus asymmetrically.  IMPRESSION: Focal low-attenuation in the inferior left cerebellum compatible with acute to subacute infarct. MRI would be helpful to further evaluate.  No evidence for an acute facial bone fracture.  Asymmetric fullness in the region of the left piriform sinus. As neoplasm can present with similar imaging features, consider direct inspection or further evaluation with CT or MRI performed with intravenous contrast material. This followup imaging of the neck could be performed as an outpatient procedure after resolution of acute symptoms and when patient is better able to cooperate with positioning and breath holding.   Electronically Signed   By: Kennith Center M.D.   On: 08/29/2013 17:26    Assessment/Plan Principal Problem:   CVA (cerebral infarction) Active Problems:  CVA CT head shows cerebellar acute or subacute  infarct, will admit the patient  for stroke workup. We'll obtain 2-D echo, MRI/MRA of the brain. Also order carotid Dopplers. Lipid profile in a.m.  CKD stage III Patient seems to be at baseline. Will continue to monitor the BUN creatinine in the hospital  Dementia Continue thioridazine tablet twice a day  Hyperlipidemia Continue Zocor Check lipid profile in a.m.  DVT prophylaxis Lovenox   Code status: Patient is full code  Family discussion: Discussed with caregiver from the group home, patient does not have any close relatives or healthcare power of attorney. Caregiver from group home are the decision makers, patient is full code at this time and they would like to perform the for stroke workup   Time Spent on Admission: 70 minutes  [facility] Pager: [phone] 08/29/2013, 8:54 PM  If 7PM-7AM, please contact night-coverage  [email or web address]  Password [email or web address]

## 2013-08-30 ENCOUNTER — Observation Stay (HOSPITAL_COMMUNITY): Payer: Medicare Other

## 2013-08-30 LAB — LIPID PANEL
CHOL/HDL RATIO: 2.2 ratio
CHOLESTEROL: 154 mg/dL (ref 0–200)
HDL: 70 mg/dL (ref >39)
LDL Cholesterol: 77 mg/dL (ref 0–99)
TRIGLYCERIDES: 36 mg/dL (ref <150)
VLDL: 7 mg/dL (ref 0–40)

## 2013-08-30 LAB — HEMOGLOBIN A1C
Hgb A1c MFr Bld: 5.7 % — ABNORMAL HIGH (ref <5.7)
MEAN PLASMA GLUCOSE: 117 mg/dL — AB (ref <117)

## 2013-08-30 MED ORDER — ENSURE COMPLETE PO LIQD
237.0000 mL | Freq: Two times a day (BID) | ORAL | Status: DC
  Administered 2013-08-30 – 2013-09-01 (×4): 237 mL via ORAL

## 2013-08-30 NOTE — Evaluation (Signed)
Occupational Therapy Evaluation Patient Details Name: Kendra [name] MRN: [id number] DOB: 09/11/1915 Today's Date: 08/30/2013    History of Present Illness 80 year old female who has a past medical history of Organic brain syndrome and Hard of hearing. patient resides at group home and was brought to the ED after patient fell out of bed this morning. Patient had bruise on the left side of the face patient was able to get up and ambulatory to the fall. In the afternoon after patient had an active when she woke up she seemed to have difficulty being weight on the left side. So she was brought to the hospital for further evaluation. Patient is a very poor historian, is not oriented x3, has dementia,  unable to provide any significant history. She denies any pain at this time.     Clinical Impression   Pt presenting to acute OT with above situation.  Family was not available to determine pt's baseline functioning, but she currently has decreased cognition and generalized weakness.  Pt was not able to participate in MMT to further determine deficits.  Pt will benefit from continued OT services for UE strengthening and to facilitate engagement in ADL tasks.  Recommend OT SNF at this time.    Follow Up Recommendations  SNF    Equipment Recommendations       Recommendations for Other Services       Precautions / Restrictions Precautions Precautions: Fall Restrictions Weight Bearing Restrictions: No      Mobility Bed Mobility                  Transfers                      Balance                                            ADL Overall ADL's : Needs assistance/impaired     Grooming: Wash/dry hands;Wash/dry face;Brushing hair;Cueing for sequencing;Sitting;Set up Grooming Details (indicate cue type and reason): Set up for groomnig ADLs with prompting to initiate activities                               General ADL Comments: Family  not present to determine baseline and pt poor historian.  Pt has difficulty following one step commands due to both being HOH and hx of dementia.     Vision                     Perception     Praxis      Pertinent Vitals/Pain Pt denies pain.     Hand Dominance     Extremity/Trunk Assessment Upper Extremity Assessment Upper Extremity Assessment: Generalized weakness;Difficult to assess due to impaired cognition (Decrased bilateral shoulder AROM. pt had difficulty reachng with left to engage in bilateral grooming task)   Lower Extremity Assessment Lower Extremity Assessment: Defer to PT evaluation       Communication Communication Communication: HOH   Cognition Arousal/Alertness: Awake/alert Behavior During Therapy: WFL for tasks assessed/performed Overall Cognitive Status: No family/caregiver present to determine baseline cognitive functioning                     General Comments       Exercises  Shoulder Instructions      Home Living Family/patient expects to be discharged to:: Skilled nursing facility                                        Prior Functioning/Environment Level of Independence: Needs assistance        Comments: Family not available for history.  Pt likely needing assist prior.    OT Diagnosis: Generalized weakness   OT Problem List: Decreased strength;Decreased range of motion;Decreased activity tolerance;Impaired balance (sitting and/or standing);Decreased cognition;Decreased safety awareness   OT Treatment/Interventions: Therapeutic exercise;Patient/family education    OT Goals(Current goals can be found in the care plan section) Acute Rehab OT Goals Patient Stated Goal: Noen stated OT Goal Formulation: With patient Time For Goal Achievement: 09/13/13 Potential to Achieve Goals: Fair ADL Goals Pt/caregiver will Perform Home Exercise Program: Increased ROM;Increased strength;Both right and left  upper extremity  OT Frequency: Min 2X/week   Barriers to D/C:            Co-evaluation              End of Session    Activity Tolerance: Patient tolerated treatment well Patient left: in chair;with chair alarm set   Time: [id number] OT Time Calculation (min): 14 min Charges:  OT General Charges $OT Visit: 1 Procedure OT Evaluation $Initial OT Evaluation Tier I: 1 Procedure G-Codes:     Marry GuanMarie Rawlings, MS, OTR/L [phone]  08/30/2013, 11:36 AM

## 2013-08-30 NOTE — Progress Notes (Signed)
Subjective: The patient was admitted from group home following a fall. She has remained stable. She does have dementia. She denies any pain. CT did show low attenuation in the anterior left cerebellum compatible with acute or subacute infarct  Objective: Vital signs in last 24 hours: Temp:  [97.5 F (36.4 C)-98.3 F (36.8 C)] 97.5 F (36.4 C) (05/12 0447) Pulse Rate:  [59-114] 59 (05/12 0447) Resp:  [12-20] 20 (05/12 0447) BP: (121-187)/(76-100) 150/82 mmHg (05/12 0447) SpO2:  [99 %-100 %] 100 % (05/12 0447) Weight:  [43.4 kg (95 lb 10.9 oz)] 43.4 kg (95 lb 10.9 oz) (05/11 2303) Weight change:  Last BM Date: 08/29/13  Intake/Output from previous day:   Intake/Output this shift:    Physical Exam: General appearance-patient does have dementia  HEENT negative  Neck supple no JVD or thyroid abnormalities  Lungs clear to P&A  Heart regular rhythm no murmurs  Abdomen the palpable organs or masses  Neurological cranial nerves intact patient is moving all extremities no abnormal reflexes   Recent Labs  08/29/13 1840  WBC 6.0  HGB 10.0*  HCT 30.2*  PLT 148*   BMET  Recent Labs  08/29/13 1840  NA 142  K 4.9  CL 108  CO2 22  GLUCOSE 118*  BUN 25*  CREATININE 1.38*  CALCIUM 8.6    Studies/Results: Dg Thoracic Spine 2 View  08/29/2013   CLINICAL DATA:  Status post fall  EXAM: THORACIC SPINE - 2 VIEW  COMPARISON:  None.  FINDINGS: The study is limited due to patient's inability to cooperate with positioning. The thoracic vertebral bodies appear grossly normal in height with the exception of L1 clear compression is suspected with loss of height of approximately 50%. No definite abnormal paravertebral soft tissue densities are demonstrated.  IMPRESSION: This is a very limited study. There may be partial compression of or L1. This is evaluated better on the accompanying lumbar spine series.   Electronically Signed   By: David  [place]   On: 08/29/2013 18:02   Dg Lumbar  Spine Complete  08/29/2013   CLINICAL DATA:  Status post fall now with back pain  EXAM: LUMBAR SPINE - COMPLETE 4+ VIEW  COMPARISON:  None.  FINDINGS: The images are limited by large amounts of overlying stool and gas. The patient has sustained compressions of the bodies of L1 and L2 and L3. The L1 vertebral body exhibits loss of height of approximately 60%. The L2 vertebral body exhibits loss of height of approximately 50%. The L3 vertebral body exhibits loss of height of nearly 90%. No definite retropulsed fragments are demonstrated. The bones are diffusely osteopenic.  IMPRESSION: There are compression fractures involving L1 through L3 as described. There are no previous studies with which to compare.   Electronically Signed   By: David  [place]   On: 08/29/2013 18:13   Dg Hip Bilateral W/pelvis  08/29/2013   CLINICAL DATA:  Status post fall  EXAM: BILATERAL HIP WITH PELVIS - 4+ VIEW  COMPARISON:  None.  FINDINGS: The bony pelvis is osteopenic. Stool and gas obscures portions of the iliac bones. There is symmetric narrowing of both hip joints. The patient has undergone previous ORIF for a midshaft femoral fracture on the left. No acute fractures demonstrated. There is dense vascular arterial calcification bilaterally. The right hip and proximal and midportions of the right femur appear intact. The knees are not included in the field of view.  IMPRESSION: There is no evidence of an acute pelvic or hip  fracture.   Electronically Signed   By: David  [place]   On: 08/29/2013 18:11   Ct Head Wo Contrast  08/29/2013   CLINICAL DATA:  Larey SeatFell when trying to get out of bed. Hit left side of face on a door.  EXAM: CT HEAD WITHOUT CONTRAST  CT MAXILLOFACIAL WITHOUT CONTRAST  CT CERVICAL SPINE WITHOUT CONTRAST  TECHNIQUE: Multidetector CT imaging of the head, cervical spine, and maxillofacial structures were performed using the standard protocol without intravenous contrast. Multiplanar CT image reconstructions of the  cervical spine and maxillofacial structures were also generated.  COMPARISON:  CT head from 12/14/2009.  FINDINGS: CT HEAD FINDINGS  There is a focal area of low attenuation in the inferior left cerebellum, suggesting subacute ischemia. No evidence for acute hemorrhage or hydrocephalus. No abnormal extra-axial fluid collection. No mass lesion is evident. Diffuse loss of parenchymal volume is consistent with atrophy. 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  The visualized paranasal sinuses and mastoid air cells are clear. Fluid in the left mastoid air cells is a stable finding.  CT MAXILLOFACIAL FINDINGS  The mandible is intact with motion artifact through the symphyseal region. . The temporomandibular joints are located. There is degenerative change in the right temporomandibular joint. Nasal bones are intact. No zygomatic arch fracture. No maxillary sinus fracture. The medial and inferior orbital walls are intact bilaterally. No air-fluid levels in the paranasal sinuses.  Soft tissue swelling/contusion noted over the left cheek.  The globes are symmetric in size and shape. Intraorbital fat is preserved bilaterally.  CT CERVICAL SPINE FINDINGS  Imaging was obtained from the skullbase through the T2 vertebral body. Cervical lordosis is accentuated. No evidence for fracture. No subluxation. There is diffuse degenerative disc and facet disease in the cervical spine. No prevertebral soft tissue swelling.  9 mm right thyroid nodule is evident. There is asymmetry of the piriform sinuses with soft tissue filling the expected location of the left piriform sinus asymmetrically.  IMPRESSION: Focal low-attenuation in the inferior left cerebellum compatible with acute to subacute infarct. MRI would be helpful to further evaluate.  No evidence for an acute facial bone fracture.  Asymmetric fullness in the  region of the left piriform sinus. As neoplasm can present with similar imaging features, consider direct inspection or further evaluation with CT or MRI performed with intravenous contrast material. This followup imaging of the neck could be performed as an outpatient procedure after resolution of acute symptoms and when patient is better able to cooperate with positioning and breath holding.   Electronically Signed   By: Kennith CenterEric  Mansell M.D.   On: 08/29/2013 17:26   Ct Cervical Spine Wo Contrast  08/29/2013   CLINICAL DATA:  Larey SeatFell when trying to get out of bed. Hit left side of face on a door.  EXAM: CT HEAD WITHOUT CONTRAST  CT MAXILLOFACIAL WITHOUT CONTRAST  CT CERVICAL SPINE WITHOUT CONTRAST  TECHNIQUE: Multidetector CT imaging of the head, cervical spine, and maxillofacial structures were performed using the standard protocol without intravenous contrast. Multiplanar CT image reconstructions of the cervical spine and maxillofacial structures were also generated.  COMPARISON:  CT head from 12/14/2009.  FINDINGS: CT HEAD FINDINGS  There is a focal area of low attenuation in the inferior left cerebellum, suggesting subacute ischemia. No evidence for acute hemorrhage or hydrocephalus. No abnormal extra-axial fluid collection. No mass lesion is evident. Diffuse loss of parenchymal volume is consistent with  atrophy. 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  The visualized paranasal sinuses and mastoid air cells are clear. Fluid in the left mastoid air cells is a stable finding.  CT MAXILLOFACIAL FINDINGS  The mandible is intact with motion artifact through the symphyseal region. . The temporomandibular joints are located. There is degenerative change in the right temporomandibular joint. Nasal bones are intact. No zygomatic arch fracture. No maxillary sinus fracture. The medial and inferior  orbital walls are intact bilaterally. No air-fluid levels in the paranasal sinuses.  Soft tissue swelling/contusion noted over the left cheek.  The globes are symmetric in size and shape. Intraorbital fat is preserved bilaterally.  CT CERVICAL SPINE FINDINGS  Imaging was obtained from the skullbase through the T2 vertebral body. Cervical lordosis is accentuated. No evidence for fracture. No subluxation. There is diffuse degenerative disc and facet disease in the cervical spine. No prevertebral soft tissue swelling.  9 mm right thyroid nodule is evident. There is asymmetry of the piriform sinuses with soft tissue filling the expected location of the left piriform sinus asymmetrically.  IMPRESSION: Focal low-attenuation in the inferior left cerebellum compatible with acute to subacute infarct. MRI would be helpful to further evaluate.  No evidence for an acute facial bone fracture.  Asymmetric fullness in the region of the left piriform sinus. As neoplasm can present with similar imaging features, consider direct inspection or further evaluation with CT or MRI performed with intravenous contrast material. This followup imaging of the neck could be performed as an outpatient procedure after resolution of acute symptoms and when patient is better able to cooperate with positioning and breath holding.   Electronically Signed   By: Kennith CenterEric  Mansell M.D.   On: 08/29/2013 17:26   Ct Lumbar Spine Wo Contrast  08/29/2013   CLINICAL DATA:  Fall.  Low back pain.  Facial bruising.  EXAM: CT LUMBAR SPINE WITHOUT CONTRAST  TECHNIQUE: Multidetector CT imaging of the lumbar spine was performed without intravenous contrast administration. Multiplanar CT image reconstructions were also generated.  COMPARISON:  None.  FINDINGS: There is severe bony demineralization along with a 60% compression fracture of L1, a 50% compression fracture of L2, and a 70% compression fracture of L3, age at which are probably chronic. Each of these has  associated posterior bony retropulsion, amounting to 6 mm at L1, 9 mm at L2, and 7 mm at L3. These compression fractures are likely chronic and centrally at the L3 compression fracture there is near complete loss of vertebral body height. Prominent bony demineralization is present diffusely. Multilevel facet arthropathy is observed.  Trace left pleural effusion. Hypodense lesion of the right kidney upper pole, technically nonspecific. Aortoiliac atherosclerotic vascular disease.  Additional findings at individual levels are as follows:  T12-L1:  No impingement identified.  L1-2: Moderate to prominent left and mild right foraminal stenosis due to spurring and bony retropulsion. Mild central narrowing of the thecal sac  L2-3:  Diffuse disc bulge without overt impingement.  L3-4: Moderate to prominent bilateral foraminal stenosis and moderate central narrowing of the thecal sac due to facet arthropathy, spurring, and posterior bony retropulsion.  L4-5: Moderate right and mild left foraminal stenosis due to facet arthropathy and disc bulge.  L5-S1: No impingement. 4 mm grade 1 anterolisthesis, degenerative in nature.  IMPRESSION: 1. Chronic compression fractures at L1, L2, and L3. 2. Lumbar spondylosis, degenerative disc disease, and chronic bony retropulsion contribute to impingement  at L1-2, L3-4, and L4-5 as detailed above. 3. Severe bony demineralization. Appearance compatible with osteoporosis. 4. Trace left pleural effusion. 5. Hypodense right kidney upper pole lesion, probably a cyst but technically nonspecific.   Electronically Signed   By: Herbie [place] M.D.   On: 08/29/2013 19:33   Ct Maxillofacial Wo Cm  08/29/2013   CLINICAL DATA:  Larey Seat when trying to get out of bed. Hit left side of face on a door.  EXAM: CT HEAD WITHOUT CONTRAST  CT MAXILLOFACIAL WITHOUT CONTRAST  CT CERVICAL SPINE WITHOUT CONTRAST  TECHNIQUE: Multidetector CT imaging of the head, cervical spine, and maxillofacial structures were  performed using the standard protocol without intravenous contrast. Multiplanar CT image reconstructions of the cervical spine and maxillofacial structures were also generated.  COMPARISON:  CT head from 12/14/2009.  FINDINGS: CT HEAD FINDINGS  There is a focal area of low attenuation in the inferior left cerebellum, suggesting subacute ischemia. No evidence for acute hemorrhage or hydrocephalus. No abnormal extra-axial fluid collection. No mass lesion is evident. Diffuse loss of parenchymal volume is consistent with atrophy. 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  The visualized paranasal sinuses and mastoid air cells are clear. Fluid in the left mastoid air cells is a stable finding.  CT MAXILLOFACIAL FINDINGS  The mandible is intact with motion artifact through the symphyseal region. . The temporomandibular joints are located. There is degenerative change in the right temporomandibular joint. Nasal bones are intact. No zygomatic arch fracture. No maxillary sinus fracture. The medial and inferior orbital walls are intact bilaterally. No air-fluid levels in the paranasal sinuses.  Soft tissue swelling/contusion noted over the left cheek.  The globes are symmetric in size and shape. Intraorbital fat is preserved bilaterally.  CT CERVICAL SPINE FINDINGS  Imaging was obtained from the skullbase through the T2 vertebral body. Cervical lordosis is accentuated. No evidence for fracture. No subluxation. There is diffuse degenerative disc and facet disease in the cervical spine. No prevertebral soft tissue swelling.  9 mm right thyroid nodule is evident. There is asymmetry of the piriform sinuses with soft tissue filling the expected location of the left piriform sinus asymmetrically.  IMPRESSION: Focal low-attenuation in the inferior left cerebellum compatible with acute to subacute infarct. MRI would  be helpful to further evaluate.  No evidence for an acute facial bone fracture.  Asymmetric fullness in the region of the left piriform sinus. As neoplasm can present with similar imaging features, consider direct inspection or further evaluation with CT or MRI performed with intravenous contrast material. This followup imaging of the neck could be performed as an outpatient procedure after resolution of acute symptoms and when patient is better able to cooperate with positioning and breath holding.   Electronically Signed   By: Kennith Center M.D.   On: 08/29/2013 17:26    Medications:  . aspirin  300 mg Rectal Daily   Or  . aspirin  325 mg Oral Daily  . enoxaparin (LOVENOX) injection  40 mg Subcutaneous Q24H  . simvastatin  20 mg Oral QPM  . thioridazine  10 mg Oral BID        Assessment/Plan:  1. CVA-focal low-attenuation in inferior left cerebellum compatible with acute or subacute infarct. Continue with stroke workup 2. Dementia continue Thorazine tablet twice daily  LOS: 1 day   Ettie Krontz G Oshen Wlodarczyk 08/30/2013, 6:16 AM

## 2013-08-30 NOTE — Consult Note (Signed)
[place] A. Merlene Laughter, MD     [email or web address]          Kendra [name] is an 80 y.o. female.   ASSESSMENT/PLAN: 1. Acute lacunar infarct .Agree with antiplatelet agent aspirin. We will follow up the MRI/MRA and carotid duplex Doppler.  2. Advanced dementia likely a mix of cortical Alzheimer's dementia and vascular dementia.Dementia labs will be obtained. Consider Aricept or other related medications for dementia.  The patient is a 80 year old black female who presents with acute Gait impairment with patient falling to the ground and sustaining left-sided head injury. During the workup the patient is noted to have a CT scan which showed hypodensity involving the left cerebellum suspicious for an acute infarct and hence she was admitted for for further evaluation. The patient has advanced dementia and is severely hearing impaired which severely limits the evaluation. She also resists evaluation.  GENERAL: Thin lady who is confused but also resistance of the evaluation.  HEENT: Supple. Moderate sized petechial hemorrhage/bruise left cheek.  ABDOMEN: soft  EXTREMITIES: No edema   BACK: Normal.  SKIN: Normal by inspection.    MENTAL STATUS: She is awake and alert. She is feeding herself but is completely disoriented and having nonsensical speech.  CRANIAL NERVES: Pupils are equal, round and reactive to light; extra ocular movements are full, there is no significant nystagmus; visual fields are Limited but appears to be full; upper and lower facial muscles are normal in strength and symmetric, there is no flattening of the nasolabial folds; tongue is midline.  MOTOR: She moves both sides well and equally without evidence of focal weakness. Exact strength is limited due to lack of cooperation.  COORDINATION: There are no dysmetria or tremors.  REFLEXES: Deep tendon reflexes are symmetrical and normal.   SENSATION: Normal to light touch.    Past Medical  History  Diagnosis Date  . Organic brain syndrome   . Hard of hearing     Past Surgical History  Procedure Laterality Date  . Chronic alcohol abuse    . Fracture surgery    . Femur fracture surgery      History reviewed. No pertinent family history.  Social History:  reports that she has never smoked. She does not have any smokeless tobacco history on file. She reports that she does not drink alcohol or use illicit drugs.  Allergies: No Known Allergies  Medications: Prior to Admission medications   Medication Sig Start Date End Date Taking? Authorizing Provider  docusate sodium (COLACE) 100 MG capsule Take 100 mg by mouth at bedtime.   Yes Historical Provider, MD  ENSURE (ENSURE) Take 237 mLs by mouth daily.   Yes Historical Provider, MD  simvastatin (ZOCOR) 20 MG tablet Take 20 mg by mouth every evening.   Yes Historical Provider, MD  thioridazine (MELLARIL) 10 MG tablet Take 10 mg by mouth 2 (two) times daily.   Yes Historical Provider, MD    Scheduled Meds: . aspirin  300 mg Rectal Daily   Or  . aspirin  325 mg Oral Daily  . enoxaparin (LOVENOX) injection  40 mg Subcutaneous Q24H  . simvastatin  20 mg Oral QPM  . thioridazine  10 mg Oral BID   Continuous Infusions:  PRN Meds:.senna-docusate   Blood pressure 164/83, pulse 59, temperature 97.5 F (36.4 C), temperature source Oral, resp. rate 20, weight 43.4 kg (95 lb 10.9 oz), SpO2 99.00%.   Results for orders placed during the hospital encounter of 08/29/13 (from the past  48 hour(s))  CBC WITH DIFFERENTIAL     Status: Abnormal   Collection Time    08/29/13  6:40 PM      Result Value Ref Range   WBC 6.0  4.0 - 10.5 K/uL   RBC 3.34 (*) 3.87 - 5.11 MIL/uL   Hemoglobin 10.0 (*) 12.0 - 15.0 g/dL   HCT 30.2 (*) 36.0 - 46.0 %   MCV 90.4  78.0 - 100.0 fL   MCH 29.9  26.0 - 34.0 pg   MCHC 33.1  30.0 - 36.0 g/dL   RDW 14.6  11.5 - 15.5 %   Platelets 148 (*) 150 - 400 K/uL   Neutrophils Relative % 78 (*) 43 - 77 %    Neutro Abs 4.7  1.7 - 7.7 K/uL   Lymphocytes Relative 13  12 - 46 %   Lymphs Abs 0.8  0.7 - 4.0 K/uL   Monocytes Relative 9  3 - 12 %   Monocytes Absolute 0.5  0.1 - 1.0 K/uL   Eosinophils Relative 0  0 - 5 %   Eosinophils Absolute 0.0  0.0 - 0.7 K/uL   Basophils Relative 0  0 - 1 %   Basophils Absolute 0.0  0.0 - 0.1 K/uL  BASIC METABOLIC PANEL     Status: Abnormal   Collection Time    08/29/13  6:40 PM      Result Value Ref Range   Sodium 142  137 - 147 mEq/L   Potassium 4.9  3.7 - 5.3 mEq/L   Chloride 108  96 - 112 mEq/L   CO2 22  19 - 32 mEq/L   Glucose, Bld 118 (*) 70 - 99 mg/dL   BUN 25 (*) 6 - 23 mg/dL   Creatinine, Ser 1.38 (*) 0.50 - 1.10 mg/dL   Calcium 8.6  8.4 - 10.5 mg/dL   GFR calc non Af Amer 31 (*) >90 mL/min   GFR calc Af Amer 36 (*) >90 mL/min   Comment: (NOTE)     The eGFR has been calculated using the CKD EPI equation.     This calculation has not been validated in all clinical situations.     eGFR's persistently <90 mL/min signify possible Chronic Kidney     Disease.  LIPID PANEL     Status: None   Collection Time    08/30/13  5:16 AM      Result Value Ref Range   Cholesterol 154  0 - 200 mg/dL   Triglycerides 36  <150 mg/dL   HDL 70  >39 mg/dL   Total CHOL/HDL Ratio 2.2     VLDL 7  0 - 40 mg/dL   LDL Cholesterol 77  0 - 99 mg/dL   Comment:            Total Cholesterol/HDL:CHD Risk     Coronary Heart Disease Risk Table                         Men   Women      1/2 Average Risk   3.4   3.3      Average Risk       5.0   4.4      2 X Average Risk   9.6   7.1      3 X Average Risk  23.4   11.0                Use the calculated  Patient Ratio     above and the CHD Risk Table     to determine the patient's CHD Risk.                ATP III CLASSIFICATION (LDL):      <100     mg/dL   Optimal      100-129  mg/dL   Near or Above                        Optimal      130-159  mg/dL   Borderline      160-189  mg/dL   High      >190     mg/dL   Very High      Dg Thoracic Spine 2 View  08/29/2013   CLINICAL DATA:  Status post fall  EXAM: THORACIC SPINE - 2 VIEW  COMPARISON:  None.  FINDINGS: The study is limited due to patient's inability to cooperate with positioning. The thoracic vertebral bodies appear grossly normal in height with the exception of L1 clear compression is suspected with loss of height of approximately 50%. No definite abnormal paravertebral soft tissue densities are demonstrated.  IMPRESSION: This is a very limited study. There may be partial compression of or L1. This is evaluated better on the accompanying lumbar spine series.   Electronically Signed   By: David  Martinique   On: 08/29/2013 18:02   Dg Lumbar Spine Complete  08/29/2013   CLINICAL DATA:  Status post fall now with back pain  EXAM: LUMBAR SPINE - COMPLETE 4+ VIEW  COMPARISON:  None.  FINDINGS: The images are limited by large amounts of overlying stool and gas. The patient has sustained compressions of the bodies of L1 and L2 and L3. The L1 vertebral body exhibits loss of height of approximately 60%. The L2 vertebral body exhibits loss of height of approximately 50%. The L3 vertebral body exhibits loss of height of nearly 90%. No definite retropulsed fragments are demonstrated. The bones are diffusely osteopenic.  IMPRESSION: There are compression fractures involving L1 through L3 as described. There are no previous studies with which to compare.   Electronically Signed   By: David  [place]   On: 08/29/2013 18:13   Dg Hip Bilateral W/pelvis  08/29/2013   CLINICAL DATA:  Status post fall  EXAM: BILATERAL HIP WITH PELVIS - 4+ VIEW  COMPARISON:  None.  FINDINGS: The bony pelvis is osteopenic. Stool and gas obscures portions of the iliac bones. There is symmetric narrowing of both hip joints. The patient has undergone previous ORIF for a midshaft femoral fracture on the left. No acute fractures demonstrated. There is dense vascular arterial calcification bilaterally. The right hip and  proximal and midportions of the right femur appear intact. The knees are not included in the field of view.  IMPRESSION: There is no evidence of an acute pelvic or hip fracture.   Electronically Signed   By: David  Martin[place]   On: 08/29/2013 18:11   Ct Head Wo Contrast  08/29/2013   CLINICAL DATA:  [place] when trying to get out of bed. Hit left side of face on a door.  EXAM: CT HEAD WITHOUT CONTRAST  CT MAXILLOFACIAL WITHOUT CONTRAST  CT CERVICAL SPINE WITHOUT CONTRAST  TECHNIQUE: Multidetector CT imaging of the head, cervical spine, and maxillofacial structures were performed using the standard protocol without intravenous contrast. Multiplanar CT image reconstructions of the cervical spine and maxillofacial structures were also  generated.  COMPARISON:  CT head from 12/14/2009.  FINDINGS: CT HEAD FINDINGS  There is a focal area of low attenuation in the inferior left cerebellum, suggesting subacute ischemia. No evidence for acute hemorrhage or hydrocephalus. No abnormal extra-axial fluid collection. No mass lesion is evident. Diffuse loss of parenchymal volume is consistent with atrophy. 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  The visualized paranasal sinuses and mastoid air cells are clear. Fluid in the left mastoid air cells is a stable finding.  CT MAXILLOFACIAL FINDINGS  The mandible is intact with motion artifact through the symphyseal region. . The temporomandibular joints are located. There is degenerative change in the right temporomandibular joint. Nasal bones are intact. No zygomatic arch fracture. No maxillary sinus fracture. The medial and inferior orbital walls are intact bilaterally. No air-fluid levels in the paranasal sinuses.  Soft tissue swelling/contusion noted over the left cheek.  The globes are symmetric in size and shape. Intraorbital fat is preserved bilaterally.  CT  CERVICAL SPINE FINDINGS  Imaging was obtained from the skullbase through the T2 vertebral body. Cervical lordosis is accentuated. No evidence for fracture. No subluxation. There is diffuse degenerative disc and facet disease in the cervical spine. No prevertebral soft tissue swelling.  9 mm right thyroid nodule is evident. There is asymmetry of the piriform sinuses with soft tissue filling the expected location of the left piriform sinus asymmetrically.  IMPRESSION: Focal low-attenuation in the inferior left cerebellum compatible with acute to subacute infarct. MRI would be helpful to further evaluate.  No evidence for an acute facial bone fracture.  Asymmetric fullness in the region of the left piriform sinus. As neoplasm can present with similar imaging features, consider direct inspection or further evaluation with CT or MRI performed with intravenous contrast material. This followup imaging of the neck could be performed as an outpatient procedure after resolution of acute symptoms and when patient is better able to cooperate with positioning and breath holding.   Electronically Signed   By: Misty Stanley M.D.   On: 08/29/2013 17:26   Ct Cervical Spine Wo Contrast  08/29/2013   CLINICAL DATA:  [place] when trying to get out of bed. Hit left side of face on a door.  EXAM: CT HEAD WITHOUT CONTRAST  CT MAXILLOFACIAL WITHOUT CONTRAST  CT CERVICAL SPINE WITHOUT CONTRAST  TECHNIQUE: Multidetector CT imaging of the head, cervical spine, and maxillofacial structures were performed using the standard protocol without intravenous contrast. Multiplanar CT image reconstructions of the cervical spine and maxillofacial structures were also generated.  COMPARISON:  CT head from 12/14/2009.  FINDINGS: CT HEAD FINDINGS  There is a focal area of low attenuation in the inferior left cerebellum, suggesting subacute ischemia. No evidence for acute hemorrhage or hydrocephalus. No abnormal extra-axial fluid collection. No mass lesion  is evident. Diffuse loss of parenchymal volume is consistent with atrophy. 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  The visualized paranasal sinuses and mastoid air cells are clear. Fluid in the left mastoid air cells is a stable finding.  CT MAXILLOFACIAL FINDINGS  The mandible is intact with motion artifact through the symphyseal region. . The temporomandibular joints are located. There is degenerative change in the right temporomandibular joint. Nasal bones are intact. No zygomatic arch fracture. No maxillary sinus fracture. The medial and inferior orbital walls are intact bilaterally. No air-fluid  levels in the paranasal sinuses.  Soft tissue swelling/contusion noted over the left cheek.  The globes are symmetric in size and shape. Intraorbital fat is preserved bilaterally.  CT CERVICAL SPINE FINDINGS  Imaging was obtained from the skullbase through the T2 vertebral body. Cervical lordosis is accentuated. No evidence for fracture. No subluxation. There is diffuse degenerative disc and facet disease in the cervical spine. No prevertebral soft tissue swelling.  9 mm right thyroid nodule is evident. There is asymmetry of the piriform sinuses with soft tissue filling the expected location of the left piriform sinus asymmetrically.  IMPRESSION: Focal low-attenuation in the inferior left cerebellum compatible with acute to subacute infarct. MRI would be helpful to further evaluate.  No evidence for an acute facial bone fracture.  Asymmetric fullness in the region of the left piriform sinus. As neoplasm can present with similar imaging features, consider direct inspection or further evaluation with CT or MRI performed with intravenous contrast material. This followup imaging of the neck could be performed as an outpatient procedure after resolution of acute symptoms and when patient is better able  to cooperate with positioning and breath holding.   Electronically Signed   By: Misty Stanley M.D.   On: 08/29/2013 17:26   Ct Lumbar Spine Wo Contrast  08/29/2013   CLINICAL DATA:  Fall.  Low back pain.  Facial bruising.  EXAM: CT LUMBAR SPINE WITHOUT CONTRAST  TECHNIQUE: Multidetector CT imaging of the lumbar spine was performed without intravenous contrast administration. Multiplanar CT image reconstructions were also generated.  COMPARISON:  None.  FINDINGS: There is severe bony demineralization along with a 60% compression fracture of L1, a 50% compression fracture of L2, and a 70% compression fracture of L3, age at which are probably chronic. Each of these has associated posterior bony retropulsion, amounting to 6 mm at L1, 9 mm at L2, and 7 mm at L3. These compression fractures are likely chronic and centrally at the L3 compression fracture there is near complete loss of vertebral body height. Prominent bony demineralization is present diffusely. Multilevel facet arthropathy is observed.  Trace left pleural effusion. Hypodense lesion of the right kidney upper pole, technically nonspecific. Aortoiliac atherosclerotic vascular disease.  Additional findings at individual levels are as follows:  T12-L1:  No impingement identified.  L1-2: Moderate to prominent left and mild right foraminal stenosis due to spurring and bony retropulsion. Mild central narrowing of the thecal sac  L2-3:  Diffuse disc bulge without overt impingement.  L3-4: Moderate to prominent bilateral foraminal stenosis and moderate central narrowing of the thecal sac due to facet arthropathy, spurring, and posterior bony retropulsion.  L4-5: Moderate right and mild left foraminal stenosis due to facet arthropathy and disc bulge.  L5-S1: No impingement. 4 mm grade 1 anterolisthesis, degenerative in nature.  IMPRESSION: 1. Chronic compression fractures at L1, L2, and L3. 2. Lumbar spondylosis, degenerative disc disease, and chronic bony  retropulsion contribute to impingement at L1-2, L3-4, and L4-5 as detailed above. 3. Severe bony demineralization. Appearance compatible with osteoporosis. 4. Trace left pleural effusion. 5. Hypodense right kidney upper pole lesion, probably a cyst but technically nonspecific.   Electronically Signed   By: Sherryl Barters M.D.   On: 08/29/2013 19:33   Ct Maxillofacial Wo Cm  08/29/2013   CLINICAL DATA:  [place] when trying to get out of bed. Hit left side of face on a door.  EXAM: CT HEAD WITHOUT CONTRAST  CT MAXILLOFACIAL WITHOUT CONTRAST  CT CERVICAL SPINE WITHOUT CONTRAST  TECHNIQUE: Multidetector CT imaging of the head, cervical spine, and maxillofacial structures were performed using the standard protocol without intravenous contrast. Multiplanar CT image reconstructions of the cervical spine and maxillofacial structures were also generated.  COMPARISON:  CT head from 12/14/2009.  FINDINGS: CT HEAD FINDINGS  There is a focal area of low attenuation in the inferior left cerebellum, suggesting subacute ischemia. No evidence for acute hemorrhage or hydrocephalus. No abnormal extra-axial fluid collection. No mass lesion is evident. Diffuse loss of parenchymal volume is consistent with atrophy. 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  The visualized paranasal sinuses and mastoid air cells are clear. Fluid in the left mastoid air cells is a stable finding.  CT MAXILLOFACIAL FINDINGS  The mandible is intact with motion artifact through the symphyseal region. . The temporomandibular joints are located. There is degenerative change in the right temporomandibular joint. Nasal bones are intact. No zygomatic arch fracture. No maxillary sinus fracture. The medial and inferior orbital walls are intact bilaterally. No air-fluid levels in the paranasal sinuses.  Soft tissue swelling/contusion noted over  the left cheek.  The globes are symmetric in size and shape. Intraorbital fat is preserved bilaterally.  CT CERVICAL SPINE FINDINGS  Imaging was obtained from the skullbase through the T2 vertebral body. Cervical lordosis is accentuated. No evidence for fracture. No subluxation. There is diffuse degenerative disc and facet disease in the cervical spine. No prevertebral soft tissue swelling.  9 mm right thyroid nodule is evident. There is asymmetry of the piriform sinuses with soft tissue filling the expected location of the left piriform sinus asymmetrically.  IMPRESSION: Focal low-attenuation in the inferior left cerebellum compatible with acute to subacute infarct. MRI would be helpful to further evaluate.  No evidence for an acute facial bone fracture.  Asymmetric fullness in the region of the left piriform sinus. As neoplasm can present with similar imaging features, consider direct inspection or further evaluation with CT or MRI performed with intravenous contrast material. This followup imaging of the neck could be performed as an outpatient procedure after resolution of acute symptoms and when patient is better able to cooperate with positioning and breath holding.   Electronically Signed   By: Misty Stanley M.D.   On: 08/29/2013 17:26        Candies Palm A. Merlene Laughter, M.D.  Diplomate, Tax adviser of Psychiatry and Neurology ( Neurology). 08/30/2013, 8:40 AM

## 2013-08-30 NOTE — Progress Notes (Signed)
INITIAL NUTRITION ASSESSMENT  DOCUMENTATION CODES Per approved criteria  -Severe malnutrition in the context of chronic illness   Pt meets criteria for severe MALNUTRITION in the context of chronic illness as evidenced by severe fat and muscle depletion.  INTERVENTION: Ensure Complete po BID, each supplement provides 350 kcal and 13 grams of protein  NUTRITION DIAGNOSIS: Inadequate oral intake related to decreased appetite as evidenced by severe muscle and fat depletion.   Goal: Pt will meet >90% of estimated nutritional needs  Monitor:  PO intake, labs, skin assessments, weight changes, I/O's  Reason for Assessment: MST=2  80 y.o. female  Admitting Dx: CVA (cerebral infarction)  ASSESSMENT: Pt is a resident of [facility] who was admitted for CVA.  Pt is pleasantly confused and unable to provide reliable history. She responded "I'm looking for my pocketbook" or "Today is Sunday. My sister is at church" to each question this RD asked. No caregivers or family members present. Unable to perform complete exam, due to pt not following directions.  Pt is very thin appearing and suspect inadequate intake and weight loss PTA, however, unable to confirm at this time due to minimal history. Noted pt [facility]  Nutrition Focused Physical Exam:  Subcutaneous Fat:  Orbital Region: severe depletion Upper Arm Region: unable to assess Thoracic and Lumbar Region: severe depletion  Muscle:  Temple Region: severe depletion Clavicle Bone Region: severe depletion Clavicle and Acromion Bone Region: severe depletion Scapular Bone Region: severe depletion Dorsal Hand: unable to assess Patellar Region: severe depletion Anterior Thigh Region: severe depletion Posterior Calf Region: moderate depletion  Edema: none present  Height: Ht Readings from Last 1 Encounters:  No data found for Ht    Weight: Wt Readings from Last 1 Encounters:   08/29/13 95 lb 10.9 oz (43.4 kg)    Ideal Body Weight: unknown  % Ideal Body Weight: unknown  Wt Readings from Last 10 Encounters:  08/29/13 95 lb 10.9 oz (43.4 kg)    Usual Body Weight: unknown  % Usual Body Weight: unknown  BMI:  There is no height on file to calculate BMI.  Estimated Nutritional Needs: Kcal: [id number] daily Protein: 54-65 grams daily Fluid: 1.3-1.5 L daily  Skin: ecchymosis on rt cheek  Diet Order: Parke SimmersBland  EDUCATION NEEDS: -Education not appropriate at this time  No intake or output data in the 24 hours ending 08/30/13 1512  Last BM: 08/29/13   Labs:   Recent Labs Lab 08/29/13 1840  NA 142  K 4.9  CL 108  CO2 22  BUN 25*  CREATININE 1.38*  CALCIUM 8.6  GLUCOSE 118*    CBG (last 3)  No results found for this basename: GLUCAP,  in the last 72 hours  Scheduled Meds: . aspirin  300 mg Rectal Daily   Or  . aspirin  325 mg Oral Daily  . enoxaparin (LOVENOX) injection  40 mg Subcutaneous Q24H  . simvastatin  20 mg Oral QPM  . thioridazine  10 mg Oral BID    Continuous Infusions:   Past Medical History  Diagnosis Date  . Organic brain syndrome   . Hard of hearing     Past Surgical History  Procedure Laterality Date  . Chronic alcohol abuse    . Fracture surgery    . Femur fracture surgery      Wyndham Santilli A. Mayford KnifeWilliams, RD, LDN Pager: [phone]

## 2013-08-31 DIAGNOSIS — E43 Unspecified severe protein-calorie malnutrition: Secondary | ICD-10-CM | POA: Insufficient documentation

## 2013-08-31 DIAGNOSIS — I319 Disease of pericardium, unspecified: Secondary | ICD-10-CM

## 2013-08-31 LAB — TSH: TSH: 1.28 u[IU]/mL (ref 0.350–4.500)

## 2013-08-31 LAB — HOMOCYSTEINE: Homocysteine: 25.4 umol/L — ABNORMAL HIGH (ref 4.0–15.4)

## 2013-08-31 LAB — VITAMIN B12: Vitamin B-12: 776 pg/mL (ref 211–911)

## 2013-08-31 NOTE — Evaluation (Signed)
Physical Therapy Evaluation Patient Details Name: Kendra [name] MRN: [id number] DOB: 03/24/1916 Today's Date: 08/31/2013   History of Present Illness  80 year old female who  has a past medical history of Organic brain syndrome and Hard of hearing. patient resides at group home and was brought to the ED after patient fell out of bed this morning. Patient had bruise on the left side of the face patient was able to get up and ambulatory to the fall. In the afternoon after patient had an active when she woke up she seemed to have difficulty being weight on the left side. So she was brought to the hospital for further evaluation. Patient is a very poor historian, is not oriented x3, has dementia,  unable to provide any significant history. She denies any pain at this time.    Clinical Impression  Pt is very confused.  She will not follow commands.  She is not a candidate for skilled therapy recommend returning to group home if they will accept or nursing home    Follow Up Recommendations No PT follow up    Equipment Recommendations    RW   Recommendations for Other Services   none    Precautions / Restrictions Precautions Precautions: Fall Restrictions Weight Bearing Restrictions: No      Mobility  Bed Mobility                  Transfers Overall transfer level: Needs assistance   Transfers: Sit to/from Stand Sit to Stand: Mod assist            Ambulation/Gait Ambulation/Gait assistance: Min assist Ambulation Distance (Feet): 10 Feet (x2) Assistive device: Rolling walker (2 wheeled)     Gait velocity interpretation: Below normal speed for age/gender       Wheelchair Mobility    Modified Rankin (Stroke Patients Only)       Balance                                             Pertinent Vitals/Pain None verbalized    Home Living Family/patient expects to be discharged to:: Group home                      Prior  Function Level of Independence: Needs assistance   Gait / Transfers Assistance Needed: unknown  ADL's / Homemaking Assistance Needed: total        Hand Dominance        Extremity/Trunk Assessment               Lower Extremity Assessment: Difficult to assess due to impaired cognition         Communication   Communication: HOH  Cognition Arousal/Alertness: Awake/alert   Overall Cognitive Status: History of cognitive impairments - at baseline                         Exercises  attempted but unable to get pt to participate      Assessment/Plan    PT Assessment Patent does not need any further PT services  PT Diagnosis     PT Problem List    PT Treatment Interventions     PT Goals (Current goals can be found in the Care Plan section) Acute Rehab PT Goals PT Goal Formulation: No goals set, d/c therapy  End of Session Equipment Utilized During Treatment: Gait belt Activity Tolerance: Patient tolerated treatment well Patient left: in chair;with nursing/sitter in room           Time: 0900-0925 PT Time Calculation (min): 25 min   Charges:   PT Evaluation $Initial PT Evaluation Tier I: 1 Procedure     PT G Codes:          Bella KennedyCynthia J Clements Toro 08/31/2013, 9:25 AM

## 2013-08-31 NOTE — Progress Notes (Signed)
Patient ID: Kendra [name], female   DOB: Apr 28, 1915, 80 y.o.   MRN: [id number]  [place] A. Merlene Laughter, MD     [email or web address]          Kendra [name] is an 80 y.o. female.   Assessment/Plan: 1. Acute left cerebral infarct. Continue with aspirin and simvastatin.   2. Remote multiple lacunar infarcts.  3. Occlusive intracranial disease.  4. Advanced dementia likely commission of vascular and Alzheimer's dementia. Again, dementia labs will be obtained. Consider Aricept or other related medications.  GENERAL: Thin lady who is confused but also resistance of the evaluation.  HEENT: Supple. Moderate sized petechial hemorrhage/bruise left cheek.  ABDOMEN: soft  EXTREMITIES: No edema  BACK: Normal.  SKIN: Normal by inspection.  MENTAL STATUS: She is awake and alert. She is feeding herself but is completely disoriented and having nonsensical speech.  CRANIAL NERVES: Pupils are equal, round and reactive to light; extra ocular movements are full, there is no significant nystagmus; visual fields are Limited but appears to be full; upper and lower facial muscles are normal in strength and symmetric, there is no flattening of the nasolabial folds; tongue is midline.  MOTOR: She moves both sides well and equally without evidence of focal weakness. Exact strength is limited due to lack of cooperation.  COORDINATION: There are no dysmetria or tremors.  REFLEXES: Deep tendon reflexes are symmetrical and normal.  SENSATION: Normal to light touch.   Objective: Vital signs in last 24 hours: Temp:  [97.3 F (36.3 C)-97.7 F (36.5 C)] 97.3 F (36.3 C) (05/13 0528) Pulse Rate:  [61-72] 64 (05/13 0528) Resp:  [20] 20 (05/13 0528) BP: (133-165)/(84-93) 165/88 mmHg (05/13 0528) SpO2:  [99 %-100 %] 99 % (05/13 0528)  Intake/Output from previous day: 05/12 0701 - 05/13 0700 In: 510 [P.O.:510] Out: -  Intake/Output this shift:   Nutritional status: Criss Rosales   Lab  Results: Results for orders placed during the hospital encounter of 08/29/13 (from the past 48 hour(s))  CBC WITH DIFFERENTIAL     Status: Abnormal   Collection Time    08/29/13  6:40 PM      Result Value Ref Range   WBC 6.0  4.0 - 10.5 K/uL   RBC 3.34 (*) 3.87 - 5.11 MIL/uL   Hemoglobin 10.0 (*) 12.0 - 15.0 g/dL   HCT 30.2 (*) 36.0 - 46.0 %   MCV 90.4  78.0 - 100.0 fL   MCH 29.9  26.0 - 34.0 pg   MCHC 33.1  30.0 - 36.0 g/dL   RDW 14.6  11.5 - 15.5 %   Platelets 148 (*) 150 - 400 K/uL   Neutrophils Relative % 78 (*) 43 - 77 %   Neutro Abs 4.7  1.7 - 7.7 K/uL   Lymphocytes Relative 13  12 - 46 %   Lymphs Abs 0.8  0.7 - 4.0 K/uL   Monocytes Relative 9  3 - 12 %   Monocytes Absolute 0.5  0.1 - 1.0 K/uL   Eosinophils Relative 0  0 - 5 %   Eosinophils Absolute 0.0  0.0 - 0.7 K/uL   Basophils Relative 0  0 - 1 %   Basophils Absolute 0.0  0.0 - 0.1 K/uL  BASIC METABOLIC PANEL     Status: Abnormal   Collection Time    08/29/13  6:40 PM      Result Value Ref Range   Sodium 142  137 - 147 mEq/L   Potassium 4.9  3.7 - 5.3 mEq/L   Chloride 108  96 - 112 mEq/L   CO2 22  19 - 32 mEq/L   Glucose, Bld 118 (*) 70 - 99 mg/dL   BUN 25 (*) 6 - 23 mg/dL   Creatinine, Ser 1.38 (*) 0.50 - 1.10 mg/dL   Calcium 8.6  8.4 - 10.5 mg/dL   GFR calc non Af Amer 31 (*) >90 mL/min   GFR calc Af Amer 36 (*) >90 mL/min   Comment: (NOTE)     The eGFR has been calculated using the CKD EPI equation.     This calculation has not been validated in all clinical situations.     eGFR's persistently <90 mL/min signify possible Chronic Kidney     Disease.  HEMOGLOBIN A1C     Status: Abnormal   Collection Time    08/30/13  5:16 AM      Result Value Ref Range   Hemoglobin A1C 5.7 (*) <5.7 %   Comment: (NOTE)                                                                               According to the ADA Clinical Practice Recommendations for 2011, when     HbA1c is used as a screening test:      >=6.5%    Diagnostic of Diabetes Mellitus               (if abnormal result is confirmed)     5.7-6.4%   Increased risk of developing Diabetes Mellitus     References:Diagnosis and Classification of Diabetes Mellitus,Diabetes     OXBD,5329,92(EQAST 1):S62-S69 and Standards of Medical Care in             Diabetes - 2011,Diabetes Care,2011,34 (Suppl 1):S11-S61.   Mean Plasma Glucose 117 (*) <117 mg/dL   Comment: Performed at [place]: None   Collection Time    08/30/13  5:16 AM      Result Value Ref Range   Cholesterol 154  0 - 200 mg/dL   Triglycerides 36  <150 mg/dL   HDL 70  >39 mg/dL   Total CHOL/HDL Ratio 2.2     VLDL 7  0 - 40 mg/dL   LDL Cholesterol 77  0 - 99 mg/dL   Comment:            Total Cholesterol/HDL:CHD Risk     Coronary Heart Disease Risk Table                         Men   Women      1/2 Average Risk   3.4   3.3      Average Risk       5.0   4.4      2 X Average Risk   9.6   7.1      3 X Average Risk  23.4   11.0                Use the calculated Patient Ratio     above and the CHD Risk Table     to  determine the patient's CHD Risk.                ATP III CLASSIFICATION (LDL):      <100     mg/dL   Optimal      100-129  mg/dL   Near or Above                        Optimal      130-159  mg/dL   Borderline      160-189  mg/dL   High      >190     mg/dL   Very High    Lipid Panel  Recent Labs  08/30/13 0516  CHOL 154  TRIG 36  HDL 70  CHOLHDL 2.2  VLDL 7  LDLCALC 77    Studies/Results: Dg Thoracic Spine 2 View  08/29/2013   CLINICAL DATA:  Status post fall  EXAM: THORACIC SPINE - 2 VIEW  COMPARISON:  None.  FINDINGS: The study is limited due to patient's inability to cooperate with positioning. The thoracic vertebral bodies appear grossly normal in height with the exception of L1 clear compression is suspected with loss of height of approximately 50%. No definite abnormal paravertebral soft tissue densities are  demonstrated.  IMPRESSION: This is a very limited study. There may be partial compression of or L1. This is evaluated better on the accompanying lumbar spine series.   Electronically Signed   By: David  Martinique   On: 08/29/2013 18:02   Dg Lumbar Spine Complete  08/29/2013   CLINICAL DATA:  Status post fall now with back pain  EXAM: LUMBAR SPINE - COMPLETE 4+ VIEW  COMPARISON:  None.  FINDINGS: The images are limited by large amounts of overlying stool and gas. The patient has sustained compressions of the bodies of L1 and L2 and L3. The L1 vertebral body exhibits loss of height of approximately 60%. The L2 vertebral body exhibits loss of height of approximately 50%. The L3 vertebral body exhibits loss of height of nearly 90%. No definite retropulsed fragments are demonstrated. The bones are diffusely osteopenic.  IMPRESSION: There are compression fractures involving L1 through L3 as described. There are no previous studies with which to compare.   Electronically Signed   By: David  Martinique   On: 08/29/2013 18:13   Dg Hip Bilateral W/pelvis  08/29/2013   CLINICAL DATA:  Status post fall  EXAM: BILATERAL HIP WITH PELVIS - 4+ VIEW  COMPARISON:  None.  FINDINGS: The bony pelvis is osteopenic. Stool and gas obscures portions of the iliac bones. There is symmetric narrowing of both hip joints. The patient has undergone previous ORIF for a midshaft femoral fracture on the left. No acute fractures demonstrated. There is dense vascular arterial calcification bilaterally. The right hip and proximal and midportions of the right femur appear intact. The knees are not included in the field of view.  IMPRESSION: There is no evidence of an acute pelvic or hip fracture.   Electronically Signed   By: David  Martinique   On: 08/29/2013 18:11   Ct Head Wo Contrast  08/29/2013   CLINICAL DATA:  [place] when trying to get out of bed. Hit left side of face on a door.  EXAM: CT HEAD WITHOUT CONTRAST  CT MAXILLOFACIAL WITHOUT CONTRAST  CT  CERVICAL SPINE WITHOUT CONTRAST  TECHNIQUE: Multidetector CT imaging of the head, cervical spine, and maxillofacial structures were performed using the standard protocol without intravenous contrast. Multiplanar  CT image reconstructions of the cervical spine and maxillofacial structures were also generated.  COMPARISON:  CT head from 12/14/2009.  FINDINGS: CT HEAD FINDINGS  There is a focal area of low attenuation in the inferior left cerebellum, suggesting subacute ischemia. No evidence for acute hemorrhage or hydrocephalus. No abnormal extra-axial fluid collection. No mass lesion is evident. Diffuse loss of parenchymal volume is consistent with atrophy. 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  The visualized paranasal sinuses and mastoid air cells are clear. Fluid in the left mastoid air cells is a stable finding.  CT MAXILLOFACIAL FINDINGS  The mandible is intact with motion artifact through the symphyseal region. . The temporomandibular joints are located. There is degenerative change in the right temporomandibular joint. Nasal bones are intact. No zygomatic arch fracture. No maxillary sinus fracture. The medial and inferior orbital walls are intact bilaterally. No air-fluid levels in the paranasal sinuses.  Soft tissue swelling/contusion noted over the left cheek.  The globes are symmetric in size and shape. Intraorbital fat is preserved bilaterally.  CT CERVICAL SPINE FINDINGS  Imaging was obtained from the skullbase through the T2 vertebral body. Cervical lordosis is accentuated. No evidence for fracture. No subluxation. There is diffuse degenerative disc and facet disease in the cervical spine. No prevertebral soft tissue swelling.  9 mm right thyroid nodule is evident. There is asymmetry of the piriform sinuses with soft tissue filling the expected location of the left piriform sinus  asymmetrically.  IMPRESSION: Focal low-attenuation in the inferior left cerebellum compatible with acute to subacute infarct. MRI would be helpful to further evaluate.  No evidence for an acute facial bone fracture.  Asymmetric fullness in the region of the left piriform sinus. As neoplasm can present with similar imaging features, consider direct inspection or further evaluation with CT or MRI performed with intravenous contrast material. This followup imaging of the neck could be performed as an outpatient procedure after resolution of acute symptoms and when patient is better able to cooperate with positioning and breath holding.   Electronically Signed   By: Misty Stanley M.D.   On: 08/29/2013 17:26   Ct Cervical Spine Wo Contrast  08/29/2013   CLINICAL DATA:  [place] when trying to get out of bed. Hit left side of face on a door.  EXAM: CT HEAD WITHOUT CONTRAST  CT MAXILLOFACIAL WITHOUT CONTRAST  CT CERVICAL SPINE WITHOUT CONTRAST  TECHNIQUE: Multidetector CT imaging of the head, cervical spine, and maxillofacial structures were performed using the standard protocol without intravenous contrast. Multiplanar CT image reconstructions of the cervical spine and maxillofacial structures were also generated.  COMPARISON:  CT head from 12/14/2009.  FINDINGS: CT HEAD FINDINGS  There is a focal area of low attenuation in the inferior left cerebellum, suggesting subacute ischemia. No evidence for acute hemorrhage or hydrocephalus. No abnormal extra-axial fluid collection. No mass lesion is evident. Diffuse loss of parenchymal volume is consistent with atrophy. 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  The visualized paranasal sinuses and mastoid air cells are clear. Fluid in the left mastoid air cells is a stable finding.  CT MAXILLOFACIAL FINDINGS  The mandible is intact with motion artifact through  the symphyseal region. . The temporomandibular joints are located. There is degenerative change in the right temporomandibular joint. Nasal bones are intact. No zygomatic arch fracture. No maxillary sinus  fracture. The medial and inferior orbital walls are intact bilaterally. No air-fluid levels in the paranasal sinuses.  Soft tissue swelling/contusion noted over the left cheek.  The globes are symmetric in size and shape. Intraorbital fat is preserved bilaterally.  CT CERVICAL SPINE FINDINGS  Imaging was obtained from the skullbase through the T2 vertebral body. Cervical lordosis is accentuated. No evidence for fracture. No subluxation. There is diffuse degenerative disc and facet disease in the cervical spine. No prevertebral soft tissue swelling.  9 mm right thyroid nodule is evident. There is asymmetry of the piriform sinuses with soft tissue filling the expected location of the left piriform sinus asymmetrically.  IMPRESSION: Focal low-attenuation in the inferior left cerebellum compatible with acute to subacute infarct. MRI would be helpful to further evaluate.  No evidence for an acute facial bone fracture.  Asymmetric fullness in the region of the left piriform sinus. As neoplasm can present with similar imaging features, consider direct inspection or further evaluation with CT or MRI performed with intravenous contrast material. This followup imaging of the neck could be performed as an outpatient procedure after resolution of acute symptoms and when patient is better able to cooperate with positioning and breath holding.   Electronically Signed   By: Misty Stanley M.D.   On: 08/29/2013 17:26   Ct Lumbar Spine Wo Contrast  08/29/2013   CLINICAL DATA:  Fall.  Low back pain.  Facial bruising.  EXAM: CT LUMBAR SPINE WITHOUT CONTRAST  TECHNIQUE: Multidetector CT imaging of the lumbar spine was performed without intravenous contrast administration. Multiplanar CT image reconstructions were also generated.   COMPARISON:  None.  FINDINGS: There is severe bony demineralization along with a 60% compression fracture of L1, a 50% compression fracture of L2, and a 70% compression fracture of L3, age at which are probably chronic. Each of these has associated posterior bony retropulsion, amounting to 6 mm at L1, 9 mm at L2, and 7 mm at L3. These compression fractures are likely chronic and centrally at the L3 compression fracture there is near complete loss of vertebral body height. Prominent bony demineralization is present diffusely. Multilevel facet arthropathy is observed.  Trace left pleural effusion. Hypodense lesion of the right kidney upper pole, technically nonspecific. Aortoiliac atherosclerotic vascular disease.  Additional findings at individual levels are as follows:  T12-L1:  No impingement identified.  L1-2: Moderate to prominent left and mild right foraminal stenosis due to spurring and bony retropulsion. Mild central narrowing of the thecal sac  L2-3:  Diffuse disc bulge without overt impingement.  L3-4: Moderate to prominent bilateral foraminal stenosis and moderate central narrowing of the thecal sac due to facet arthropathy, spurring, and posterior bony retropulsion.  L4-5: Moderate right and mild left foraminal stenosis due to facet arthropathy and disc bulge.  L5-S1: No impingement. 4 mm grade 1 anterolisthesis, degenerative in nature.  IMPRESSION: 1. Chronic compression fractures at L1, L2, and L3. 2. Lumbar spondylosis, degenerative disc disease, and chronic bony retropulsion contribute to impingement at L1-2, L3-4, and L4-5 as detailed above. 3. Severe bony demineralization. Appearance compatible with osteoporosis. 4. Trace left pleural effusion. 5. Hypodense right kidney upper pole lesion, probably a cyst but technically nonspecific.   Electronically Signed   By: Sherryl Barters M.D.   On: 08/29/2013 19:33   Mr Jodene Nam Head Wo Contrast  08/30/2013   CLINICAL DATA:  Recent fall. Dementia. Left  cerebellar infarct on head CT.  EXAM: MRI HEAD WITHOUT CONTRAST  MRA HEAD WITHOUT CONTRAST  TECHNIQUE: Multiplanar, multiecho pulse sequences of the brain and surrounding structures were obtained without intravenous contrast. Angiographic images of the head were obtained using MRA technique without contrast.  COMPARISON:  Head CT 08/29/2013  FINDINGS: MRI HEAD FINDINGS  There are a few small foci of restricted diffusion in the inferior left cerebellum consistent with acute infarcts, the largest measuring 1.2 cm in size. Remote bilateral cerebellar infarcts are also noted, left greater than right. Remote left basal ganglia/ corona radiata lacunar infarcts are noted. Patchy and confluent T2 hyperintensities within the subcortical and deep cerebral white matter and pons are compatible with moderate chronic small vessel ischemic disease. There is moderate cerebral atrophy. There is no evidence of intracranial hemorrhage, mass, midline shift, or extra-axial fluid collection.  Orbits are unremarkable. Minimal ethmoid air cell mucosal thickening and a tiny right maxillary sinus mucous retention cyst are noted. There is a small to moderate-size left mastoid effusion. Abnormal appearance of the distal left vertebral artery flow void is suggestive of occlusion.  MRA HEAD FINDINGS  Distal right vertebral artery is patent with very mild narrowing of the proximal V4 segment without significant stenosis. Flow related enhancement is identified in the distal left vertebral artery at the C1 level with occlusion distally in the V3 segment. The left V4 segment is largely occluded, with a short segment of flow present in the mid V4 segment. The basilar artery is supplied by the right vertebral artery. PICA origins are not identified. AICA origins appear patent, with the right being mildly larger than the left. SCA origins are patent. Basilar artery is patent without stenosis. There is mild P2 segment irregularity of the right PCA.  Tandem moderate stenoses are present in the left P2 segment. Posterior communicating arteries are not identified.  Internal carotid arteries are patent from skullbase to carotid termini. There is mild irregular narrowing of the carotid siphons bilaterally, left greater than right, without evidence of flow-limiting stenosis. ACAs are unremarkable. There is diffuse, moderate narrowing of the distal right M1 segment. There is mild-to-moderate attenuation and irregularity of MCA branches more distally bilaterally. No intracranial aneurysm is identified.  IMPRESSION: 1. Small, acute left cerebellar infarcts. 2. Remote, left greater than right cerebellar infarcts and left basal ganglia infarcts. 3. Moderate chronic small vessel ischemic disease. 4. 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 5. Moderate intracranial atherosclerosis with greatest involvement of the left PCA and right MCA.   Electronically Signed   By: Logan Bores   On: 08/30/2013 10:02   Mr Brain Wo Contrast  08/30/2013   CLINICAL DATA:  Recent fall. Dementia. Left cerebellar infarct on head CT.  EXAM: MRI HEAD WITHOUT CONTRAST  MRA HEAD WITHOUT CONTRAST  TECHNIQUE: Multiplanar, multiecho pulse sequences of the brain and surrounding structures were obtained without intravenous contrast. Angiographic images of the head were obtained using MRA technique without contrast.  COMPARISON:  Head CT 08/29/2013  FINDINGS: MRI HEAD FINDINGS  There are a few small foci of restricted diffusion in the inferior left cerebellum consistent with acute infarcts, the largest measuring 1.2 cm in size. Remote bilateral cerebellar infarcts are also noted, left greater than right. Remote left basal ganglia/ corona radiata lacunar infarcts are noted. Patchy and confluent T2 hyperintensities within the subcortical and deep cerebral white matter and pons are compatible with moderate chronic small vessel ischemic disease. There is moderate cerebral atrophy. There is no  evidence of intracranial hemorrhage, mass, midline shift, or extra-axial fluid collection.  Orbits are unremarkable. Minimal ethmoid air cell  mucosal thickening and a tiny right maxillary sinus mucous retention cyst are noted. There is a small to moderate-size left mastoid effusion. Abnormal appearance of the distal left vertebral artery flow void is suggestive of occlusion.  MRA HEAD FINDINGS  Distal right vertebral artery is patent with very mild narrowing of the proximal V4 segment without significant stenosis. Flow related enhancement is identified in the distal left vertebral artery at the C1 level with occlusion distally in the V3 segment. The left V4 segment is largely occluded, with a short segment of flow present in the mid V4 segment. The basilar artery is supplied by the right vertebral artery. PICA origins are not identified. AICA origins appear patent, with the right being mildly larger than the left. SCA origins are patent. Basilar artery is patent without stenosis. There is mild P2 segment irregularity of the right PCA. Tandem moderate stenoses are present in the left P2 segment. Posterior communicating arteries are not identified.  Internal carotid arteries are patent from skullbase to carotid termini. There is mild irregular narrowing of the carotid siphons bilaterally, left greater than right, without evidence of flow-limiting stenosis. ACAs are unremarkable. There is diffuse, moderate narrowing of the distal right M1 segment. There is mild-to-moderate attenuation and irregularity of MCA branches more distally bilaterally. No intracranial aneurysm is identified.  IMPRESSION: 1. Small, acute left cerebellar infarcts. 2. Remote, left greater than right cerebellar infarcts and left basal ganglia infarcts. 3. Moderate chronic small vessel ischemic disease. 4. 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 5. Moderate intracranial atherosclerosis with greatest involvement of the left PCA and right MCA.    Electronically Signed   By: Logan Bores   On: 08/30/2013 10:02   US Carotid Bilateral  08/30/2013   CLINICAL DATA:  Hypertension, stroke, syncope.  EXAM: BILATERAL CAROTID DUPLEX ULTRASOUND  TECHNIQUE: Pearline Cables scale imaging, color Doppler and duplex ultrasound was performed of bilateral carotid and vertebral arteries in the neck.  COMPARISON:  None.  REVIEW OF SYSTEMS: Quantification of carotid stenosis is based on velocity parameters that correlate the residual internal carotid diameter with NASCET-based stenosis levels, using the diameter of the distal internal carotid lumen as the denominator for stenosis measurement.  The following velocity measurements were obtained:  PEAK SYSTOLIC/END DIASTOLIC  RIGHT  ICA:                     31/12cm/sec  CCA:                     60/10XN/ATF  SYSTOLIC ICA/CCA RATIO:  0.6  DIASTOLIC ICA/CCA RATIO: 5.73  ECA:                     39cm/sec  LEFT  ICA:                     41/17cm/sec  CCA:                     22/02RK/YHC  SYSTOLIC ICA/CCA RATIO:  6.23  DIASTOLIC ICA/CCA RATIO: 7.62  ECA:                     59cm/sec  FINDINGS: RIGHT CAROTID ARTERY: Eccentric partially calcified plaque effaces the carotid bulb and extends into the proximal internal and external carotid arteries without high-grade stenosis. Normal waveforms and color Doppler signal. Distal ICA is tortuous.  RIGHT VERTEBRAL ARTERY:  Normal flow direction and waveform.  LEFT CAROTID  ARTERY: Circumferential irregular plaque in the bulb into the proximal ICA without high-grade stenosis. Normal waveforms and color Doppler signal.  LEFT VERTEBRAL ARTERY: Normal flow direction and waveform.  IMPRESSION: 1. Bilateral carotid bifurcation and proximal ICA plaque, resulting in less than 50% diameter stenosis. The exam does not exclude plaque ulceration or embolization. Continued surveillance recommended.   Electronically Signed   By: Arne Cleveland M.D.   On: 08/30/2013 15:35   Ct Maxillofacial Wo Cm  08/29/2013    CLINICAL DATA:  [place] when trying to get out of bed. Hit left side of face on a door.  EXAM: CT HEAD WITHOUT CONTRAST  CT MAXILLOFACIAL WITHOUT CONTRAST  CT CERVICAL SPINE WITHOUT CONTRAST  TECHNIQUE: Multidetector CT imaging of the head, cervical spine, and maxillofacial structures were performed using the standard protocol without intravenous contrast. Multiplanar CT image reconstructions of the cervical spine and maxillofacial structures were also generated.  COMPARISON:  CT head from 12/14/2009.  FINDINGS: CT HEAD FINDINGS  There is a focal area of low attenuation in the inferior left cerebellum, suggesting subacute ischemia. No evidence for acute hemorrhage or hydrocephalus. No abnormal extra-axial fluid collection. No mass lesion is evident. Diffuse loss of parenchymal volume is consistent with atrophy. 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  The visualized paranasal sinuses and mastoid air cells are clear. Fluid in the left mastoid air cells is a stable finding.  CT MAXILLOFACIAL FINDINGS  The mandible is intact with motion artifact through the symphyseal region. . The temporomandibular joints are located. There is degenerative change in the right temporomandibular joint. Nasal bones are intact. No zygomatic arch fracture. No maxillary sinus fracture. The medial and inferior orbital walls are intact bilaterally. No air-fluid levels in the paranasal sinuses.  Soft tissue swelling/contusion noted over the left cheek.  The globes are symmetric in size and shape. Intraorbital fat is preserved bilaterally.  CT CERVICAL SPINE FINDINGS  Imaging was obtained from the skullbase through the T2 vertebral body. Cervical lordosis is accentuated. No evidence for fracture. No subluxation. There is diffuse degenerative disc and facet disease in the cervical spine. No prevertebral soft tissue swelling.  9  mm right thyroid nodule is evident. There is asymmetry of the piriform sinuses with soft tissue filling the expected location of the left piriform sinus asymmetrically.  IMPRESSION: Focal low-attenuation in the inferior left cerebellum compatible with acute to subacute infarct. MRI would be helpful to further evaluate.  No evidence for an acute facial bone fracture.  Asymmetric fullness in the region of the left piriform sinus. As neoplasm can present with similar imaging features, consider direct inspection or further evaluation with CT or MRI performed with intravenous contrast material. This followup imaging of the neck could be performed as an outpatient procedure after resolution of acute symptoms and when patient is better able to cooperate with positioning and breath holding.   Electronically Signed   By: Misty Stanley M.D.   On: 08/29/2013 17:26    Medications:  Scheduled Meds: . aspirin  300 mg Rectal Daily   Or  . aspirin  325 mg Oral Daily  . enoxaparin (LOVENOX) injection  40 mg Subcutaneous Q24H  . feeding supplement (ENSURE COMPLETE)  237 mL Oral BID BM  . simvastatin  20 mg Oral QPM  . thioridazine  10 mg Oral BID   Continuous Infusions:  PRN Meds:.senna-docusate     LOS: 2 days  Cordia Miklos A. Merlene Laughter, M.D.  Diplomate, [employer] of Psychiatry and Neurology ( Neurology).

## 2013-08-31 NOTE — Progress Notes (Signed)
Subjective: The patient remains fairly stable. She was admitted from group home following a fall. She still denies any pain. CT did show low-attenuation area left anterior cerebellum compatible with acute or subacute infarct  Objective: Vital signs in last 24 hours: Temp:  [97.3 F (36.3 C)-97.7 F (36.5 C)] 97.3 F (36.3 C) (05/13 0528) Pulse Rate:  [59-72] 64 (05/13 0528) Resp:  [20] 20 (05/13 0528) BP: (133-165)/(83-93) 165/88 mmHg (05/13 0528) SpO2:  [99 %-100 %] 99 % (05/13 0528) Weight change:  Last BM Date: 08/29/13  Intake/Output from previous day: 05/12 0701 - 05/13 0700 In: 510 [P.O.:510] Out: -  Intake/Output this shift: Total I/O In: 240 [P.O.:240] Out: -   Physical Exam: General appearance-patient is fairly alert  HEENT negative  Neck supple no JVD or thyroid abnormalities  Lungs clear to P&A  Heart regular rhythm no murmurs  Abdomen no palpable organs or masses  Neurological cranial nerves intact no motor or Sentry abnormalities noted   Recent Labs  08/29/13 1840  WBC 6.0  HGB 10.0*  HCT 30.2*  PLT 148*   BMET  Recent Labs  08/29/13 1840  NA 142  K 4.9  CL 108  CO2 22  GLUCOSE 118*  BUN 25*  CREATININE 1.38*  CALCIUM 8.6    Studies/Results: Dg Thoracic Spine 2 View  08/29/2013   CLINICAL DATA:  Status post fall  EXAM: THORACIC SPINE - 2 VIEW  COMPARISON:  None.  FINDINGS: The study is limited due to patient's inability to cooperate with positioning. The thoracic vertebral bodies appear grossly normal in height with the exception of L1 clear compression is suspected with loss of height of approximately 50%. No definite abnormal paravertebral soft tissue densities are demonstrated.  IMPRESSION: This is a very limited study. There may be partial compression of or L1. This is evaluated better on the accompanying lumbar spine series.   Electronically Signed   By: David  [place]   On: 08/29/2013 18:02   Dg Lumbar Spine Complete  08/29/2013    CLINICAL DATA:  Status post fall now with back pain  EXAM: LUMBAR SPINE - COMPLETE 4+ VIEW  COMPARISON:  None.  FINDINGS: The images are limited by large amounts of overlying stool and gas. The patient has sustained compressions of the bodies of L1 and L2 and L3. The L1 vertebral body exhibits loss of height of approximately 60%. The L2 vertebral body exhibits loss of height of approximately 50%. The L3 vertebral body exhibits loss of height of nearly 90%. No definite retropulsed fragments are demonstrated. The bones are diffusely osteopenic.  IMPRESSION: There are compression fractures involving L1 through L3 as described. There are no previous studies with which to compare.   Electronically Signed   By: David  [place]   On: 08/29/2013 18:13   Dg Hip Bilateral W/pelvis  08/29/2013   CLINICAL DATA:  Status post fall  EXAM: BILATERAL HIP WITH PELVIS - 4+ VIEW  COMPARISON:  None.  FINDINGS: The bony pelvis is osteopenic. Stool and gas obscures portions of the iliac bones. There is symmetric narrowing of both hip joints. The patient has undergone previous ORIF for a midshaft femoral fracture on the left. No acute fractures demonstrated. There is dense vascular arterial calcification bilaterally. The right hip and proximal and midportions of the right femur appear intact. The knees are not included in the field of view.  IMPRESSION: There is no evidence of an acute pelvic or hip fracture.   Electronically Signed   By:  David  [place]   On: 08/29/2013 18:11   Ct Head Wo Contrast  08/29/2013   CLINICAL DATA:  Larey SeatFell when trying to get out of bed. Hit left side of face on a door.  EXAM: CT HEAD WITHOUT CONTRAST  CT MAXILLOFACIAL WITHOUT CONTRAST  CT CERVICAL SPINE WITHOUT CONTRAST  TECHNIQUE: Multidetector CT imaging of the head, cervical spine, and maxillofacial structures were performed using the standard protocol without intravenous contrast. Multiplanar CT image reconstructions of the cervical spine and maxillofacial  structures were also generated.  COMPARISON:  CT head from 12/14/2009.  FINDINGS: CT HEAD FINDINGS  There is a focal area of low attenuation in the inferior left cerebellum, suggesting subacute ischemia. No evidence for acute hemorrhage or hydrocephalus. No abnormal extra-axial fluid collection. No mass lesion is evident. Diffuse loss of parenchymal volume is consistent with atrophy. 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  The visualized paranasal sinuses and mastoid air cells are clear. Fluid in the left mastoid air cells is a stable finding.  CT MAXILLOFACIAL FINDINGS  The mandible is intact with motion artifact through the symphyseal region. . The temporomandibular joints are located. There is degenerative change in the right temporomandibular joint. Nasal bones are intact. No zygomatic arch fracture. No maxillary sinus fracture. The medial and inferior orbital walls are intact bilaterally. No air-fluid levels in the paranasal sinuses.  Soft tissue swelling/contusion noted over the left cheek.  The globes are symmetric in size and shape. Intraorbital fat is preserved bilaterally.  CT CERVICAL SPINE FINDINGS  Imaging was obtained from the skullbase through the T2 vertebral body. Cervical lordosis is accentuated. No evidence for fracture. No subluxation. There is diffuse degenerative disc and facet disease in the cervical spine. No prevertebral soft tissue swelling.  9 mm right thyroid nodule is evident. There is asymmetry of the piriform sinuses with soft tissue filling the expected location of the left piriform sinus asymmetrically.  IMPRESSION: Focal low-attenuation in the inferior left cerebellum compatible with acute to subacute infarct. MRI would be helpful to further evaluate.  No evidence for an acute facial bone fracture.  Asymmetric fullness in the region of the left piriform sinus.  As neoplasm can present with similar imaging features, consider direct inspection or further evaluation with CT or MRI performed with intravenous contrast material. This followup imaging of the neck could be performed as an outpatient procedure after resolution of acute symptoms and when patient is better able to cooperate with positioning and breath holding.   Electronically Signed   By: Kennith CenterEric  Mansell M.D.   On: 08/29/2013 17:26   Ct Cervical Spine Wo Contrast  08/29/2013   CLINICAL DATA:  Larey SeatFell when trying to get out of bed. Hit left side of face on a door.  EXAM: CT HEAD WITHOUT CONTRAST  CT MAXILLOFACIAL WITHOUT CONTRAST  CT CERVICAL SPINE WITHOUT CONTRAST  TECHNIQUE: Multidetector CT imaging of the head, cervical spine, and maxillofacial structures were performed using the standard protocol without intravenous contrast. Multiplanar CT image reconstructions of the cervical spine and maxillofacial structures were also generated.  COMPARISON:  CT head from 12/14/2009.  FINDINGS: CT HEAD FINDINGS  There is a focal area of low attenuation in the inferior left cerebellum, suggesting subacute ischemia. No evidence for acute hemorrhage or hydrocephalus. No abnormal extra-axial fluid collection. No mass lesion is evident. Diffuse loss of parenchymal volume is consistent with atrophy. Patchy low attenuation in the deep hemispheric  and periventricular white matter is nonspecific, but likely reflects chronic microvascular ischemic demyelination.  The visualized paranasal sinuses and mastoid air cells are clear. Fluid in the left mastoid air cells is a stable finding.  CT MAXILLOFACIAL FINDINGS  The mandible is intact with motion artifact through the symphyseal region. . The temporomandibular joints are located. There is degenerative change in the right temporomandibular joint. Nasal bones are intact. No zygomatic arch fracture. No maxillary sinus fracture. The medial and inferior orbital walls are intact bilaterally. No  air-fluid levels in the paranasal sinuses.  Soft tissue swelling/contusion noted over the left cheek.  The globes are symmetric in size and shape. Intraorbital fat is preserved bilaterally.  CT CERVICAL SPINE FINDINGS  Imaging was obtained from the skullbase through the T2 vertebral body. Cervical lordosis is accentuated. No evidence for fracture. No subluxation. There is diffuse degenerative disc and facet disease in the cervical spine. No prevertebral soft tissue swelling.  9 mm right thyroid nodule is evident. There is asymmetry of the piriform sinuses with soft tissue filling the expected location of the left piriform sinus asymmetrically.  IMPRESSION: Focal low-attenuation in the inferior left cerebellum compatible with acute to subacute infarct. MRI would be helpful to further evaluate.  No evidence for an acute facial bone fracture.  Asymmetric fullness in the region of the left piriform sinus. As neoplasm can present with similar imaging features, consider direct inspection or further evaluation with CT or MRI performed with intravenous contrast material. This followup imaging of the neck could be performed as an outpatient procedure after resolution of acute symptoms and when patient is better able to cooperate with positioning and breath holding.   Electronically Signed   By: Kennith Center M.D.   On: 08/29/2013 17:26   Ct Lumbar Spine Wo Contrast  08/29/2013   CLINICAL DATA:  Fall.  Low back pain.  Facial bruising.  EXAM: CT LUMBAR SPINE WITHOUT CONTRAST  TECHNIQUE: Multidetector CT imaging of the lumbar spine was performed without intravenous contrast administration. Multiplanar CT image reconstructions were also generated.  COMPARISON:  None.  FINDINGS: There is severe bony demineralization along with a 60% compression fracture of L1, a 50% compression fracture of L2, and a 70% compression fracture of L3, age at which are probably chronic. Each of these has associated posterior bony retropulsion,  amounting to 6 mm at L1, 9 mm at L2, and 7 mm at L3. These compression fractures are likely chronic and centrally at the L3 compression fracture there is near complete loss of vertebral body height. Prominent bony demineralization is present diffusely. Multilevel facet arthropathy is observed.  Trace left pleural effusion. Hypodense lesion of the right kidney upper pole, technically nonspecific. Aortoiliac atherosclerotic vascular disease.  Additional findings at individual levels are as follows:  T12-L1:  No impingement identified.  L1-2: Moderate to prominent left and mild right foraminal stenosis due to spurring and bony retropulsion. Mild central narrowing of the thecal sac  L2-3:  Diffuse disc bulge without overt impingement.  L3-4: Moderate to prominent bilateral foraminal stenosis and moderate central narrowing of the thecal sac due to facet arthropathy, spurring, and posterior bony retropulsion.  L4-5: Moderate right and mild left foraminal stenosis due to facet arthropathy and disc bulge.  L5-S1: No impingement. 4 mm grade 1 anterolisthesis, degenerative in nature.  IMPRESSION: 1. Chronic compression fractures at L1, L2, and L3. 2. Lumbar spondylosis, degenerative disc disease, and chronic bony retropulsion contribute to impingement at L1-2, L3-4, and L4-5 as detailed above.  3. Severe bony demineralization. Appearance compatible with osteoporosis. 4. Trace left pleural effusion. 5. Hypodense right kidney upper pole lesion, probably a cyst but technically nonspecific.   Electronically Signed   By: Herbie BaltimoreWalt  Liebkemann M.D.   On: 08/29/2013 19:33   Mr Maxine GlennMra Head Wo Contrast  08/30/2013   CLINICAL DATA:  Recent fall. Dementia. Left cerebellar infarct on head CT.  EXAM: MRI HEAD WITHOUT CONTRAST  MRA HEAD WITHOUT CONTRAST  TECHNIQUE: Multiplanar, multiecho pulse sequences of the brain and surrounding structures were obtained without intravenous contrast. Angiographic images of the head were obtained using MRA  technique without contrast.  COMPARISON:  Head CT 08/29/2013  FINDINGS: MRI HEAD FINDINGS  There are a few small foci of restricted diffusion in the inferior left cerebellum consistent with acute infarcts, the largest measuring 1.2 cm in size. Remote bilateral cerebellar infarcts are also noted, left greater than right. Remote left basal ganglia/ corona radiata lacunar infarcts are noted. Patchy and confluent T2 hyperintensities within the subcortical and deep cerebral white matter and pons are compatible with moderate chronic small vessel ischemic disease. There is moderate cerebral atrophy. There is no evidence of intracranial hemorrhage, mass, midline shift, or extra-axial fluid collection.  Orbits are unremarkable. Minimal ethmoid air cell mucosal thickening and a tiny right maxillary sinus mucous retention cyst are noted. There is a small to moderate-size left mastoid effusion. Abnormal appearance of the distal left vertebral artery flow void is suggestive of occlusion.  MRA HEAD FINDINGS  Distal right vertebral artery is patent with very mild narrowing of the proximal V4 segment without significant stenosis. Flow related enhancement is identified in the distal left vertebral artery at the C1 level with occlusion distally in the V3 segment. The left V4 segment is largely occluded, with a short segment of flow present in the mid V4 segment. The basilar artery is supplied by the right vertebral artery. PICA origins are not identified. AICA origins appear patent, with the right being mildly larger than the left. SCA origins are patent. Basilar artery is patent without stenosis. There is mild P2 segment irregularity of the right PCA. Tandem moderate stenoses are present in the left P2 segment. Posterior communicating arteries are not identified.  Internal carotid arteries are patent from skullbase to carotid termini. There is mild irregular narrowing of the carotid siphons bilaterally, left greater than right,  without evidence of flow-limiting stenosis. ACAs are unremarkable. There is diffuse, moderate narrowing of the distal right M1 segment. There is mild-to-moderate attenuation and irregularity of MCA branches more distally bilaterally. No intracranial aneurysm is identified.  IMPRESSION: 1. Small, acute left cerebellar infarcts. 2. Remote, left greater than right cerebellar infarcts and left basal ganglia infarcts. 3. Moderate chronic small vessel ischemic disease. 4. 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 5. Moderate intracranial atherosclerosis with greatest involvement of the left PCA and right MCA.   Electronically Signed   By: Sebastian AcheAllen  Grady   On: 08/30/2013 10:02   Mr Brain Wo Contrast  08/30/2013   CLINICAL DATA:  Recent fall. Dementia. Left cerebellar infarct on head CT.  EXAM: MRI HEAD WITHOUT CONTRAST  MRA HEAD WITHOUT CONTRAST  TECHNIQUE: Multiplanar, multiecho pulse sequences of the brain and surrounding structures were obtained without intravenous contrast. Angiographic images of the head were obtained using MRA technique without contrast.  COMPARISON:  Head CT 08/29/2013  FINDINGS: MRI HEAD FINDINGS  There are a few small foci of restricted diffusion in the inferior left cerebellum consistent with acute infarcts, the largest  measuring 1.2 cm in size. Remote bilateral cerebellar infarcts are also noted, left greater than right. Remote left basal ganglia/ corona radiata lacunar infarcts are noted. Patchy and confluent T2 hyperintensities within the subcortical and deep cerebral white matter and pons are compatible with moderate chronic small vessel ischemic disease. There is moderate cerebral atrophy. There is no evidence of intracranial hemorrhage, mass, midline shift, or extra-axial fluid collection.  Orbits are unremarkable. Minimal ethmoid air cell mucosal thickening and a tiny right maxillary sinus mucous retention cyst are noted. There is a small to moderate-size left mastoid effusion.  Abnormal appearance of the distal left vertebral artery flow void is suggestive of occlusion.  MRA HEAD FINDINGS  Distal right vertebral artery is patent with very mild narrowing of the proximal V4 segment without significant stenosis. Flow related enhancement is identified in the distal left vertebral artery at the C1 level with occlusion distally in the V3 segment. The left V4 segment is largely occluded, with a short segment of flow present in the mid V4 segment. The basilar artery is supplied by the right vertebral artery. PICA origins are not identified. AICA origins appear patent, with the right being mildly larger than the left. SCA origins are patent. Basilar artery is patent without stenosis. There is mild P2 segment irregularity of the right PCA. Tandem moderate stenoses are present in the left P2 segment. Posterior communicating arteries are not identified.  Internal carotid arteries are patent from skullbase to carotid termini. There is mild irregular narrowing of the carotid siphons bilaterally, left greater than right, without evidence of flow-limiting stenosis. ACAs are unremarkable. There is diffuse, moderate narrowing of the distal right M1 segment. There is mild-to-moderate attenuation and irregularity of MCA branches more distally bilaterally. No intracranial aneurysm is identified.  IMPRESSION: 1. Small, acute left cerebellar infarcts. 2. Remote, left greater than right cerebellar infarcts and left basal ganglia infarcts. 3. Moderate chronic small vessel ischemic disease. 4. 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 5. Moderate intracranial atherosclerosis with greatest involvement of the left PCA and right MCA.   Electronically Signed   By: Sebastian Ache   On: 08/30/2013 10:02   US Carotid Bilateral  08/30/2013   CLINICAL DATA:  Hypertension, stroke, syncope.  EXAM: BILATERAL CAROTID DUPLEX ULTRASOUND  TECHNIQUE: Wallace Cullens scale imaging, color Doppler and duplex ultrasound was performed of  bilateral carotid and vertebral arteries in the neck.  COMPARISON:  None.  REVIEW OF SYSTEMS: Quantification of carotid stenosis is based on velocity parameters that correlate the residual internal carotid diameter with NASCET-based stenosis levels, using the diameter of the distal internal carotid lumen as the denominator for stenosis measurement.  The following velocity measurements were obtained:  PEAK SYSTOLIC/END DIASTOLIC  RIGHT  ICA:                     31/12cm/sec  CCA:                     51/15cm/sec  SYSTOLIC ICA/CCA RATIO:  0.6  DIASTOLIC ICA/CCA RATIO: 0.78  ECA:                     39cm/sec  LEFT  ICA:                     41/17cm/sec  CCA:                     45/16cm/sec  SYSTOLIC ICA/CCA RATIO:  0.89  DIASTOLIC ICA/CCA RATIO: 1.11  ECA:                     59cm/sec  FINDINGS: RIGHT CAROTID ARTERY: Eccentric partially calcified plaque effaces the carotid bulb and extends into the proximal internal and external carotid arteries without high-grade stenosis. Normal waveforms and color Doppler signal. Distal ICA is tortuous.  RIGHT VERTEBRAL ARTERY:  Normal flow direction and waveform.  LEFT CAROTID ARTERY: Circumferential irregular plaque in the bulb into the proximal ICA without high-grade stenosis. Normal waveforms and color Doppler signal.  LEFT VERTEBRAL ARTERY: Normal flow direction and waveform.  IMPRESSION: 1. Bilateral carotid bifurcation and proximal ICA plaque, resulting in less than 50% diameter stenosis. The exam does not exclude plaque ulceration or embolization. Continued surveillance recommended.   Electronically Signed   By: Oley Balm M.D.   On: 08/30/2013 15:35   Ct Maxillofacial Wo Cm  08/29/2013   CLINICAL DATA:  Larey Seat when trying to get out of bed. Hit left side of face on a door.  EXAM: CT HEAD WITHOUT CONTRAST  CT MAXILLOFACIAL WITHOUT CONTRAST  CT CERVICAL SPINE WITHOUT CONTRAST  TECHNIQUE: Multidetector CT imaging of the head, cervical spine, and maxillofacial structures  were performed using the standard protocol without intravenous contrast. Multiplanar CT image reconstructions of the cervical spine and maxillofacial structures were also generated.  COMPARISON:  CT head from 12/14/2009.  FINDINGS: CT HEAD FINDINGS  There is a focal area of low attenuation in the inferior left cerebellum, suggesting subacute ischemia. No evidence for acute hemorrhage or hydrocephalus. No abnormal extra-axial fluid collection. No mass lesion is evident. Diffuse loss of parenchymal volume is consistent with atrophy. 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  The visualized paranasal sinuses and mastoid air cells are clear. Fluid in the left mastoid air cells is a stable finding.  CT MAXILLOFACIAL FINDINGS  The mandible is intact with motion artifact through the symphyseal region. . The temporomandibular joints are located. There is degenerative change in the right temporomandibular joint. Nasal bones are intact. No zygomatic arch fracture. No maxillary sinus fracture. The medial and inferior orbital walls are intact bilaterally. No air-fluid levels in the paranasal sinuses.  Soft tissue swelling/contusion noted over the left cheek.  The globes are symmetric in size and shape. Intraorbital fat is preserved bilaterally.  CT CERVICAL SPINE FINDINGS  Imaging was obtained from the skullbase through the T2 vertebral body. Cervical lordosis is accentuated. No evidence for fracture. No subluxation. There is diffuse degenerative disc and facet disease in the cervical spine. No prevertebral soft tissue swelling.  9 mm right thyroid nodule is evident. There is asymmetry of the piriform sinuses with soft tissue filling the expected location of the left piriform sinus asymmetrically.  IMPRESSION: Focal low-attenuation in the inferior left cerebellum compatible with acute to subacute infarct. MRI  would be helpful to further evaluate.  No evidence for an acute facial bone fracture.  Asymmetric fullness in the region of the left piriform sinus. As neoplasm can present with similar imaging features, consider direct inspection or further evaluation with CT or MRI performed with intravenous contrast material. This followup imaging of the neck could be performed as an outpatient procedure after resolution of acute symptoms and when patient is better able to cooperate with positioning and breath holding.   Electronically Signed   By: Kennith Center M.D.   On: 08/29/2013 17:26  Medications:  . aspirin  300 mg Rectal Daily   Or  . aspirin  325 mg Oral Daily  . enoxaparin (LOVENOX) injection  40 mg Subcutaneous Q24H  . feeding supplement (ENSURE COMPLETE)  237 mL Oral BID BM  . simvastatin  20 mg Oral QPM  . thioridazine  10 mg Oral BID        Assessment/Plan: 1. CVA-focal low-attenuation infarction left cerebellum carotid Doppler ultrasound showed less than 50% stenosis  2. Dementia plan to continue Thorazine twice daily   LOS: 2 days   Neysa Arts G Sigmond Patalano 08/31/2013, 6:29 AM

## 2013-08-31 NOTE — Progress Notes (Signed)
Occupational Therapy Treatment Patient Details Name: Kendra [name] MRN: [id number] DOB: [birth year] Today's Date: 08/31/2013    History of present illness 80 year old female who  has a past medical history of Organic brain syndrome and Hard of hearing. patient resides at group home and was brought to the ED after patient fell out of bed this morning. Patient had bruise on the left side of the face patient was able to get up and ambulatory to the fall. In the afternoon after patient had an active when she woke up she seemed to have difficulty being weight on the left side. So she was brought to the hospital for further evaluation. Patient is a very poor historian, is not oriented x3, has dementia,  unable to provide any significant history. She denies any pain at this time.     OT comments  Pt participated in UE exercises with one demonstrative cue and with fingertip assist to guide movements.  Pt demonstrated ability to engage in grooming activities with min cueing for initiation.  Pt will benefit from skilled OT to encourage ADL participation.   Follow Up Recommendations  SNF    Equipment Recommendations       Recommendations for Other Services      Precautions / Restrictions         Mobility Bed Mobility                  Transfers                      Balance                                   ADL       Grooming: Set up Grooming Details (indicate cue type and reason): Putting on lotion.  Pt required one demonstrative cue, and was then able to thouroughly rub lotion into both hands and face, carefully avoiding eyes and mouth with no cueing.                                      Vision                     Perception     Praxis      Cognition                             Extremity/Trunk Assessment               Exercises Shoulder Exercises Shoulder Flexion: AAROM;10 reps Shoulder  ABduction: AAROM;10 reps Elbow Flexion: AROM;10 reps Elbow Extension: AROM;10 reps Hand Exercises Wrist Flexion: 10 reps;AROM Wrist Extension: 10 reps;AROM   Shoulder Instructions       General Comments      Pertinent Vitals/ Pain         Home Living                                          Prior Functioning/Environment              Frequency Min 2X/week     Progress Toward Goals  OT Goals(current goals can now be found in the care  plan section)  Progress towards OT goals: Progressing toward goals  Acute Rehab OT Goals Patient Stated Goal: None stated OT Goal Formulation: With patient Time For Goal Achievement: 09/13/13 Potential to Achieve Goals: Fair  Plan Discharge plan remains appropriate    Co-evaluation                 End of Session     Activity Tolerance Patient tolerated treatment well   Patient Left in bed;with call bell/phone within reach;with bed alarm set   Nurse Communication          Time: [id number] OT Time Calculation (min): 11 min  Charges: OT General Charges $OT Visit: 1 Procedure OT Treatments $Therapeutic Activity: 8-22 mins  Marry GuanMarie Rawlings, MS, OTR/L [phone]  08/31/2013, 2:39 PM

## 2013-08-31 NOTE — Progress Notes (Signed)
  Echocardiogram 2D Echocardiogram has been performed.  Renae FickleCynthia L Tobey Lippard 08/31/2013, 2:07 PM

## 2013-08-31 NOTE — Clinical Social Work Psychosocial (Signed)
     Clinical Social Work Department BRIEF PSYCHOSOCIAL ASSESSMENT 08/31/2013  Patient:  Kendra [name], Kendra [name]     Account Number:  [id number]     Admit date:  08/29/2013  Clinical Social Worker:  Iona Coach  Date/Time:  08/31/2013 03:43 PM  Referred by:  Physician  Date Referred:  08/30/2013 Referred for  Other - See comment   Other Referral:   From ALF   Interview type:  Patient Other interview type:   Hard of hearing and pleasantly confused    PSYCHOSOCIAL DATA Living Status:  FACILITY Admitted from facility:  Other Level of care:  Assisted Living Primary support name:  April Ellison   [phone] Primary support relationship to patient:   Degree of support available:    CURRENT CONCERNS Current Concerns  Post-Acute Placement   Other Concerns:   Plan return to ALF    SOCIAL WORK ASSESSMENT / PLAN 80 year old female- resident of [place]. CSW met with patient today. She was noted to be very hard of hearing and has difficulty in understanding questions. She was able to respond appropriatly to at least half of the conversation. Patient related that she knew she was in the hospital but not which one; was unable to relate the names of any family or support persons.  CSW has left message for staff at [facility] care to call back to obtain collateral information.  Patient relates that she wants to return to facility.  Message also left for contact person for patient- April Ellison and awaiting call back. Fl2 placed on chart for MD's signature.  Physical Therapy does not feel that patient can participate in therapies and recommends return to facility or a nursing center if they are unable to provide appropriate level of care.  OT recommends SNF.   Assessment/plan status:  Psychosocial Support/Ongoing Assessment of Needs Other assessment/ plan:   Information/referral to community resources:   None at this time.    PATIENTS/FAMILYS RESPONSE TO PLAN  OF CARE: Patient is alert and oriented to person. She is very hard of hearing and this may be affecting her comprehension. She is pleasant and responsive when engaged in conversation.  Patient is unable to state where she lives but does want to return there.  She does not know names of any family or support persons.  CSW will continue attempts to reach family/facility to insure that they will be able to accept patient back when medically stable.  Will need to determine care needs after talking with facility and family.  Lorie Phenix. [place], [place]

## 2013-09-01 MED ORDER — ASPIRIN 325 MG PO TABS: 325.0000 mg | ORAL_TABLET | Freq: Every day | ORAL | Status: DC

## 2013-09-01 NOTE — Care Management Utilization Note (Signed)
UR completed 

## 2013-09-01 NOTE — Discharge Summary (Signed)
Physician Discharge Summary  Kendra ChaseMattie E [name] XBJ:[id number] DOB: Mar 12, 1916 DOA: 08/29/2013  PCP: Alice ReichertMCINNIS,Tab Rylee G, MD  Admit date: 08/29/2013 Discharge date: 09/01/2013     Discharge Diagnoses:  1. Acute cerebellar infarction 2. Chronic kidney disease stage III 3. Dementia Alzheimer type  Discharge Condition: Stable Disposition: Return to group home  Diet recommendation: Regular diet with supplements  Filed Weights   08/29/13 2303  Weight: 43.4 kg (95 lb 10.9 oz)    History of present illness:  The patient was brought to the ED after having fallen at group home. CT of head showed low attenuation area and cerebellum thought to be either acute or subacute infarction. She is also thought to have remote lacunar infarcts. Thoracic and lumbar spine showed compression fractures in bodies of L1-2 and 3 felt to be chronic. There was no evidence of acute fracture. No fractur facial bones. The patient did have a stroke workup while in hospital. This included carotid Doppler ultrasound which showed less than 50% stenosis bilateral and 2-D echo which did not reveal evidence of blood clot. The patient was treated with aspirin 325 mg daily. She was given Lovenox injections and continued on her Thorazine 10 mg twice a day and feeding supplement of Ensure. She was seen by neurology as well  Hospital Course:  See above the patient remained stable while in hospital. She was mentally confused throughout her hospital stay. She did have carotid Doppler ultrasound which showed less than 50% stenosis bilaterally and 2-D echo which did not show evidence of blood clot. She was treated with aspirin 325 mg daily and continued on her Thorazine 10 mg twice a day she did have occupational therapy and physical therapy.   Discharge Instructions The patient is to continue the following medications she will be sent back to group home. She should be on Ensure daily    Medication List         aspirin 325 MG tablet   Take 1 tablet (325 mg total) by mouth daily.     docusate sodium 100 MG capsule  Commonly known as:  COLACE  Take 100 mg by mouth at bedtime.     ENSURE  Take 237 mLs by mouth daily.     simvastatin 20 MG tablet  Commonly known as:  ZOCOR  Take 20 mg by mouth every evening.     thioridazine 10 MG tablet  Commonly known as:  MELLARIL  Take 10 mg by mouth 2 (two) times daily.       No Known Allergies  The results of significant diagnostics from this hospitalization (including imaging, microbiology, ancillary and laboratory) are listed below for reference.    Significant Diagnostic Studies: Dg Thoracic Spine 2 View  08/29/2013   CLINICAL DATA:  Status post fall  EXAM: THORACIC SPINE - 2 VIEW  COMPARISON:  None.  FINDINGS: The study is limited due to patient's inability to cooperate with positioning. The thoracic vertebral bodies appear grossly normal in height with the exception of L1 clear compression is suspected with loss of height of approximately 50%. No definite abnormal paravertebral soft tissue densities are demonstrated.  IMPRESSION: This is a very limited study. There may be partial compression of or L1. This is evaluated better on the accompanying lumbar spine series.   Electronically Signed   By: David  [place]   On: 08/29/2013 18:02   Dg Lumbar Spine Complete  08/29/2013   CLINICAL DATA:  Status post fall now with back pain  EXAM: LUMBAR SPINE -  COMPLETE 4+ VIEW  COMPARISON:  None.  FINDINGS: The images are limited by large amounts of overlying stool and gas. The patient has sustained compressions of the bodies of L1 and L2 and L3. The L1 vertebral body exhibits loss of height of approximately 60%. The L2 vertebral body exhibits loss of height of approximately 50%. The L3 vertebral body exhibits loss of height of nearly 90%. No definite retropulsed fragments are demonstrated. The bones are diffusely osteopenic.  IMPRESSION: There are compression fractures involving L1 through  L3 as described. There are no previous studies with which to compare.   Electronically Signed   By: David  [place]   On: 08/29/2013 18:13   Dg Hip Bilateral W/pelvis  08/29/2013   CLINICAL DATA:  Status post fall  EXAM: BILATERAL HIP WITH PELVIS - 4+ VIEW  COMPARISON:  None.  FINDINGS: The bony pelvis is osteopenic. Stool and gas obscures portions of the iliac bones. There is symmetric narrowing of both hip joints. The patient has undergone previous ORIF for a midshaft femoral fracture on the left. No acute fractures demonstrated. There is dense vascular arterial calcification bilaterally. The right hip and proximal and midportions of the right femur appear intact. The knees are not included in the field of view.  IMPRESSION: There is no evidence of an acute pelvic or hip fracture.   Electronically Signed   By: David  [place]   On: 08/29/2013 18:11   Ct Head Wo Contrast  08/29/2013   CLINICAL DATA:  Larey Seat when trying to get out of bed. Hit left side of face on a door.  EXAM: CT HEAD WITHOUT CONTRAST  CT MAXILLOFACIAL WITHOUT CONTRAST  CT CERVICAL SPINE WITHOUT CONTRAST  TECHNIQUE: Multidetector CT imaging of the head, cervical spine, and maxillofacial structures were performed using the standard protocol without intravenous contrast. Multiplanar CT image reconstructions of the cervical spine and maxillofacial structures were also generated.  COMPARISON:  CT head from 12/14/2009.  FINDINGS: CT HEAD FINDINGS  There is a focal area of low attenuation in the inferior left cerebellum, suggesting subacute ischemia. No evidence for acute hemorrhage or hydrocephalus. No abnormal extra-axial fluid collection. No mass lesion is evident. Diffuse loss of parenchymal volume is consistent with atrophy. 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  The visualized paranasal sinuses and mastoid air cells are clear.  Fluid in the left mastoid air cells is a stable finding.  CT MAXILLOFACIAL FINDINGS  The mandible is intact with motion artifact through the symphyseal region. . The temporomandibular joints are located. There is degenerative change in the right temporomandibular joint. Nasal bones are intact. No zygomatic arch fracture. No maxillary sinus fracture. The medial and inferior orbital walls are intact bilaterally. No air-fluid levels in the paranasal sinuses.  Soft tissue swelling/contusion noted over the left cheek.  The globes are symmetric in size and shape. Intraorbital fat is preserved bilaterally.  CT CERVICAL SPINE FINDINGS  Imaging was obtained from the skullbase through the T2 vertebral body. Cervical lordosis is accentuated. No evidence for fracture. No subluxation. There is diffuse degenerative disc and facet disease in the cervical spine. No prevertebral soft tissue swelling.  9 mm right thyroid nodule is evident. There is asymmetry of the piriform sinuses with soft tissue filling the expected location of the left piriform sinus asymmetrically.  IMPRESSION: Focal low-attenuation in the inferior left cerebellum compatible with acute to subacute infarct. MRI would be helpful to further  evaluate.  No evidence for an acute facial bone fracture.  Asymmetric fullness in the region of the left piriform sinus. As neoplasm can present with similar imaging features, consider direct inspection or further evaluation with CT or MRI performed with intravenous contrast material. This followup imaging of the neck could be performed as an outpatient procedure after resolution of acute symptoms and when patient is better able to cooperate with positioning and breath holding.   Electronically Signed   By: Kennith Center M.D.   On: 08/29/2013 17:26   Ct Cervical Spine Wo Contrast  08/29/2013   CLINICAL DATA:  Larey Seat when trying to get out of bed. Hit left side of face on a door.  EXAM: CT HEAD WITHOUT CONTRAST  CT MAXILLOFACIAL  WITHOUT CONTRAST  CT CERVICAL SPINE WITHOUT CONTRAST  TECHNIQUE: Multidetector CT imaging of the head, cervical spine, and maxillofacial structures were performed using the standard protocol without intravenous contrast. Multiplanar CT image reconstructions of the cervical spine and maxillofacial structures were also generated.  COMPARISON:  CT head from 12/14/2009.  FINDINGS: CT HEAD FINDINGS  There is a focal area of low attenuation in the inferior left cerebellum, suggesting subacute ischemia. No evidence for acute hemorrhage or hydrocephalus. No abnormal extra-axial fluid collection. No mass lesion is evident. Diffuse loss of parenchymal volume is consistent with atrophy. 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  The visualized paranasal sinuses and mastoid air cells are clear. Fluid in the left mastoid air cells is a stable finding.  CT MAXILLOFACIAL FINDINGS  The mandible is intact with motion artifact through the symphyseal region. . The temporomandibular joints are located. There is degenerative change in the right temporomandibular joint. Nasal bones are intact. No zygomatic arch fracture. No maxillary sinus fracture. The medial and inferior orbital walls are intact bilaterally. No air-fluid levels in the paranasal sinuses.  Soft tissue swelling/contusion noted over the left cheek.  The globes are symmetric in size and shape. Intraorbital fat is preserved bilaterally.  CT CERVICAL SPINE FINDINGS  Imaging was obtained from the skullbase through the T2 vertebral body. Cervical lordosis is accentuated. No evidence for fracture. No subluxation. There is diffuse degenerative disc and facet disease in the cervical spine. No prevertebral soft tissue swelling.  9 mm right thyroid nodule is evident. There is asymmetry of the piriform sinuses with soft tissue filling the expected location of the  left piriform sinus asymmetrically.  IMPRESSION: Focal low-attenuation in the inferior left cerebellum compatible with acute to subacute infarct. MRI would be helpful to further evaluate.  No evidence for an acute facial bone fracture.  Asymmetric fullness in the region of the left piriform sinus. As neoplasm can present with similar imaging features, consider direct inspection or further evaluation with CT or MRI performed with intravenous contrast material. This followup imaging of the neck could be performed as an outpatient procedure after resolution of acute symptoms and when patient is better able to cooperate with positioning and breath holding.   Electronically Signed   By: Kennith Center M.D.   On: 08/29/2013 17:26   Ct Lumbar Spine Wo Contrast  08/29/2013   CLINICAL DATA:  Fall.  Low back pain.  Facial bruising.  EXAM: CT LUMBAR SPINE WITHOUT CONTRAST  TECHNIQUE: Multidetector CT imaging of the lumbar spine was performed without intravenous contrast administration. Multiplanar CT image reconstructions were also generated.  COMPARISON:  None.  FINDINGS: There is severe bony  demineralization along with a 60% compression fracture of L1, a 50% compression fracture of L2, and a 70% compression fracture of L3, age at which are probably chronic. Each of these has associated posterior bony retropulsion, amounting to 6 mm at L1, 9 mm at L2, and 7 mm at L3. These compression fractures are likely chronic and centrally at the L3 compression fracture there is near complete loss of vertebral body height. Prominent bony demineralization is present diffusely. Multilevel facet arthropathy is observed.  Trace left pleural effusion. Hypodense lesion of the right kidney upper pole, technically nonspecific. Aortoiliac atherosclerotic vascular disease.  Additional findings at individual levels are as follows:  T12-L1:  No impingement identified.  L1-2: Moderate to prominent left and mild right foraminal stenosis due to  spurring and bony retropulsion. Mild central narrowing of the thecal sac  L2-3:  Diffuse disc bulge without overt impingement.  L3-4: Moderate to prominent bilateral foraminal stenosis and moderate central narrowing of the thecal sac due to facet arthropathy, spurring, and posterior bony retropulsion.  L4-5: Moderate right and mild left foraminal stenosis due to facet arthropathy and disc bulge.  L5-S1: No impingement. 4 mm grade 1 anterolisthesis, degenerative in nature.  IMPRESSION: 1. Chronic compression fractures at L1, L2, and L3. 2. Lumbar spondylosis, degenerative disc disease, and chronic bony retropulsion contribute to impingement at L1-2, L3-4, and L4-5 as detailed above. 3. Severe bony demineralization. Appearance compatible with osteoporosis. 4. Trace left pleural effusion. 5. Hypodense right kidney upper pole lesion, probably a cyst but technically nonspecific.   Electronically Signed   By: Herbie Baltimore M.D.   On: 08/29/2013 19:33   Mr Maxine Glenn Head Wo Contrast  08/30/2013   CLINICAL DATA:  Recent fall. Dementia. Left cerebellar infarct on head CT.  EXAM: MRI HEAD WITHOUT CONTRAST  MRA HEAD WITHOUT CONTRAST  TECHNIQUE: Multiplanar, multiecho pulse sequences of the brain and surrounding structures were obtained without intravenous contrast. Angiographic images of the head were obtained using MRA technique without contrast.  COMPARISON:  Head CT 08/29/2013  FINDINGS: MRI HEAD FINDINGS  There are a few small foci of restricted diffusion in the inferior left cerebellum consistent with acute infarcts, the largest measuring 1.2 cm in size. Remote bilateral cerebellar infarcts are also noted, left greater than right. Remote left basal ganglia/ corona radiata lacunar infarcts are noted. Patchy and confluent T2 hyperintensities within the subcortical and deep cerebral white matter and pons are compatible with moderate chronic small vessel ischemic disease. There is moderate cerebral atrophy. There is no  evidence of intracranial hemorrhage, mass, midline shift, or extra-axial fluid collection.  Orbits are unremarkable. Minimal ethmoid air cell mucosal thickening and a tiny right maxillary sinus mucous retention cyst are noted. There is a small to moderate-size left mastoid effusion. Abnormal appearance of the distal left vertebral artery flow void is suggestive of occlusion.  MRA HEAD FINDINGS  Distal right vertebral artery is patent with very mild narrowing of the proximal V4 segment without significant stenosis. Flow related enhancement is identified in the distal left vertebral artery at the C1 level with occlusion distally in the V3 segment. The left V4 segment is largely occluded, with a short segment of flow present in the mid V4 segment. The basilar artery is supplied by the right vertebral artery. PICA origins are not identified. AICA origins appear patent, with the right being mildly larger than the left. SCA origins are patent. Basilar artery is patent without stenosis. There is mild P2 segment irregularity of the right PCA.  Tandem moderate stenoses are present in the left P2 segment. Posterior communicating arteries are not identified.  Internal carotid arteries are patent from skullbase to carotid termini. There is mild irregular narrowing of the carotid siphons bilaterally, left greater than right, without evidence of flow-limiting stenosis. ACAs are unremarkable. There is diffuse, moderate narrowing of the distal right M1 segment. There is mild-to-moderate attenuation and irregularity of MCA branches more distally bilaterally. No intracranial aneurysm is identified.  IMPRESSION: 1. Small, acute left cerebellar infarcts. 2. Remote, left greater than right cerebellar infarcts and left basal ganglia infarcts. 3. Moderate chronic small vessel ischemic disease. 4. 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 5. Moderate intracranial atherosclerosis with greatest involvement of the left PCA and right MCA.    Electronically Signed   By: Sebastian AcheAllen  Grady   On: 08/30/2013 10:02   Mr Brain Wo Contrast  08/30/2013   CLINICAL DATA:  Recent fall. Dementia. Left cerebellar infarct on head CT.  EXAM: MRI HEAD WITHOUT CONTRAST  MRA HEAD WITHOUT CONTRAST  TECHNIQUE: Multiplanar, multiecho pulse sequences of the brain and surrounding structures were obtained without intravenous contrast. Angiographic images of the head were obtained using MRA technique without contrast.  COMPARISON:  Head CT 08/29/2013  FINDINGS: MRI HEAD FINDINGS  There are a few small foci of restricted diffusion in the inferior left cerebellum consistent with acute infarcts, the largest measuring 1.2 cm in size. Remote bilateral cerebellar infarcts are also noted, left greater than right. Remote left basal ganglia/ corona radiata lacunar infarcts are noted. Patchy and confluent T2 hyperintensities within the subcortical and deep cerebral white matter and pons are compatible with moderate chronic small vessel ischemic disease. There is moderate cerebral atrophy. There is no evidence of intracranial hemorrhage, mass, midline shift, or extra-axial fluid collection.  Orbits are unremarkable. Minimal ethmoid air cell mucosal thickening and a tiny right maxillary sinus mucous retention cyst are noted. There is a small to moderate-size left mastoid effusion. Abnormal appearance of the distal left vertebral artery flow void is suggestive of occlusion.  MRA HEAD FINDINGS  Distal right vertebral artery is patent with very mild narrowing of the proximal V4 segment without significant stenosis. Flow related enhancement is identified in the distal left vertebral artery at the C1 level with occlusion distally in the V3 segment. The left V4 segment is largely occluded, with a short segment of flow present in the mid V4 segment. The basilar artery is supplied by the right vertebral artery. PICA origins are not identified. AICA origins appear patent, with the right being mildly  larger than the left. SCA origins are patent. Basilar artery is patent without stenosis. There is mild P2 segment irregularity of the right PCA. Tandem moderate stenoses are present in the left P2 segment. Posterior communicating arteries are not identified.  Internal carotid arteries are patent from skullbase to carotid termini. There is mild irregular narrowing of the carotid siphons bilaterally, left greater than right, without evidence of flow-limiting stenosis. ACAs are unremarkable. There is diffuse, moderate narrowing of the distal right M1 segment. There is mild-to-moderate attenuation and irregularity of MCA branches more distally bilaterally. No intracranial aneurysm is identified.  IMPRESSION: 1. Small, acute left cerebellar infarcts. 2. Remote, left greater than right cerebellar infarcts and left basal ganglia infarcts. 3. Moderate chronic small vessel ischemic disease. 4. 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 5. Moderate intracranial atherosclerosis with greatest involvement of the left PCA and right MCA.   Electronically Signed   By: Sebastian AcheAllen  Grady  On: 08/30/2013 10:02   US Carotid Bilateral  08/30/2013   CLINICAL DATA:  Hypertension, stroke, syncope.  EXAM: BILATERAL CAROTID DUPLEX ULTRASOUND  TECHNIQUE: Wallace Cullens scale imaging, color Doppler and duplex ultrasound was performed of bilateral carotid and vertebral arteries in the neck.  COMPARISON:  None.  REVIEW OF SYSTEMS: Quantification of carotid stenosis is based on velocity parameters that correlate the residual internal carotid diameter with NASCET-based stenosis levels, using the diameter of the distal internal carotid lumen as the denominator for stenosis measurement.  The following velocity measurements were obtained:  PEAK SYSTOLIC/END DIASTOLIC  RIGHT  ICA:                     31/12cm/sec  CCA:                     51/15cm/sec  SYSTOLIC ICA/CCA RATIO:  0.6  DIASTOLIC ICA/CCA RATIO: 0.78  ECA:                     39cm/sec  LEFT  ICA:                      41/17cm/sec  CCA:                     45/16cm/sec  SYSTOLIC ICA/CCA RATIO:  0.89  DIASTOLIC ICA/CCA RATIO: 1.11  ECA:                     59cm/sec  FINDINGS: RIGHT CAROTID ARTERY: Eccentric partially calcified plaque effaces the carotid bulb and extends into the proximal internal and external carotid arteries without high-grade stenosis. Normal waveforms and color Doppler signal. Distal ICA is tortuous.  RIGHT VERTEBRAL ARTERY:  Normal flow direction and waveform.  LEFT CAROTID ARTERY: Circumferential irregular plaque in the bulb into the proximal ICA without high-grade stenosis. Normal waveforms and color Doppler signal.  LEFT VERTEBRAL ARTERY: Normal flow direction and waveform.  IMPRESSION: 1. Bilateral carotid bifurcation and proximal ICA plaque, resulting in less than 50% diameter stenosis. The exam does not exclude plaque ulceration or embolization. Continued surveillance recommended.   Electronically Signed   By: Oley Balm M.D.   On: 08/30/2013 15:35   Ct Maxillofacial Wo Cm  08/29/2013   CLINICAL DATA:  Larey Seat when trying to get out of bed. Hit left side of face on a door.  EXAM: CT HEAD WITHOUT CONTRAST  CT MAXILLOFACIAL WITHOUT CONTRAST  CT CERVICAL SPINE WITHOUT CONTRAST  TECHNIQUE: Multidetector CT imaging of the head, cervical spine, and maxillofacial structures were performed using the standard protocol without intravenous contrast. Multiplanar CT image reconstructions of the cervical spine and maxillofacial structures were also generated.  COMPARISON:  CT head from 12/14/2009.  FINDINGS: CT HEAD FINDINGS  There is a focal area of low attenuation in the inferior left cerebellum, suggesting subacute ischemia. No evidence for acute hemorrhage or hydrocephalus. No abnormal extra-axial fluid collection. No mass lesion is evident. Diffuse loss of parenchymal volume is consistent with atrophy. Patchy low attenuation in the deep hemispheric and periventricular white matter is  nonspecific, but likely reflects chronic microvascular ischemic demyelination.  The visualized paranasal sinuses and mastoid air cells are clear. Fluid in the left mastoid air cells is a stable finding.  CT MAXILLOFACIAL FINDINGS  The mandible is intact with motion artifact through the symphyseal region. . The temporomandibular joints are located. There is degenerative change in the right temporomandibular joint. Nasal bones  are intact. No zygomatic arch fracture. No maxillary sinus fracture. The medial and inferior orbital walls are intact bilaterally. No air-fluid levels in the paranasal sinuses.  Soft tissue swelling/contusion noted over the left cheek.  The globes are symmetric in size and shape. Intraorbital fat is preserved bilaterally.  CT CERVICAL SPINE FINDINGS  Imaging was obtained from the skullbase through the T2 vertebral body. Cervical lordosis is accentuated. No evidence for fracture. No subluxation. There is diffuse degenerative disc and facet disease in the cervical spine. No prevertebral soft tissue swelling.  9 mm right thyroid nodule is evident. There is asymmetry of the piriform sinuses with soft tissue filling the expected location of the left piriform sinus asymmetrically.  IMPRESSION: Focal low-attenuation in the inferior left cerebellum compatible with acute to subacute infarct. MRI would be helpful to further evaluate.  No evidence for an acute facial bone fracture.  Asymmetric fullness in the region of the left piriform sinus. As neoplasm can present with similar imaging features, consider direct inspection or further evaluation with CT or MRI performed with intravenous contrast material. This followup imaging of the neck could be performed as an outpatient procedure after resolution of acute symptoms and when patient is better able to cooperate with positioning and breath holding.   Electronically Signed   By: Kennith Center M.D.   On: 08/29/2013 17:26    Microbiology: No results found  for this or any previous visit (from the past 240 hour(s)).   Labs: Basic Metabolic Panel:  Recent Labs Lab 08/29/13 1840  NA 142  K 4.9  CL 108  CO2 22  GLUCOSE 118*  BUN 25*  CREATININE 1.38*  CALCIUM 8.6   Liver Function Tests: No results found for this basename: AST, ALT, ALKPHOS, BILITOT, PROT, ALBUMIN,  in the last 168 hours No results found for this basename: LIPASE, AMYLASE,  in the last 168 hours No results found for this basename: AMMONIA,  in the last 168 hours CBC:  Recent Labs Lab 08/29/13 1840  WBC 6.0  NEUTROABS 4.7  HGB 10.0*  HCT 30.2*  MCV 90.4  PLT 148*   Cardiac Enzymes: No results found for this basename: CKTOTAL, CKMB, CKMBINDEX, TROPONINI,  in the last 168 hours BNP: BNP (last 3 results) No results found for this basename: PROBNP,  in the last 8760 hours CBG: No results found for this basename: GLUCAP,  in the last 168 hours  Principal Problem:   CVA (cerebral infarction) Active Problems:   CVA (cerebral vascular accident)   Protein-calorie malnutrition, severe   Time coordinating discharge: 30 minutes  Signed:  Butch Penny, MD 09/01/2013, 6:19 AM

## 2013-09-01 NOTE — Progress Notes (Signed)
IV removed. Patient dressed and ready for transport back to Assisted Living Facility.

## 2013-09-01 NOTE — Clinical Social Work Note (Signed)
[facility]- [facility]- plan for the facility to transport later this morning-  Reece LevyJanet Taetum Flewellen, MSW, Theresia MajorsLCSWA [phone]

## 2013-11-12 ENCOUNTER — Emergency Department (HOSPITAL_COMMUNITY)
Admission: EM | Admit: 2013-11-12 | Discharge: 2013-11-12 | Disposition: A | Discharge status: Home / Self Care | Payer: Medicare Other | Attending: Emergency Medicine | Admitting: Emergency Medicine

## 2013-11-12 ENCOUNTER — Encounter (HOSPITAL_COMMUNITY): Payer: Self-pay | Admitting: Emergency Medicine

## 2013-11-12 ENCOUNTER — Emergency Department (HOSPITAL_COMMUNITY): Payer: Medicare Other

## 2013-11-12 DIAGNOSIS — Y939 Activity, unspecified: Secondary | ICD-10-CM | POA: Insufficient documentation

## 2013-11-12 DIAGNOSIS — Y929 Unspecified place or not applicable: Secondary | ICD-10-CM | POA: Insufficient documentation

## 2013-11-12 DIAGNOSIS — R296 Repeated falls: Secondary | ICD-10-CM | POA: Diagnosis not present

## 2013-11-12 DIAGNOSIS — F0390 Unspecified dementia without behavioral disturbance: Secondary | ICD-10-CM | POA: Diagnosis not present

## 2013-11-12 DIAGNOSIS — S0083XA Contusion of other part of head, initial encounter: Secondary | ICD-10-CM | POA: Insufficient documentation

## 2013-11-12 DIAGNOSIS — Z79899 Other long term (current) drug therapy: Secondary | ICD-10-CM | POA: Insufficient documentation

## 2013-11-12 DIAGNOSIS — S0003XA Contusion of scalp, initial encounter: Secondary | ICD-10-CM | POA: Diagnosis not present

## 2013-11-12 DIAGNOSIS — Z7982 Long term (current) use of aspirin: Secondary | ICD-10-CM | POA: Diagnosis not present

## 2013-11-12 DIAGNOSIS — S0993XA Unspecified injury of face, initial encounter: Secondary | ICD-10-CM | POA: Diagnosis present

## 2013-11-12 DIAGNOSIS — Z8669 Personal history of other diseases of the nervous system and sense organs: Secondary | ICD-10-CM | POA: Insufficient documentation

## 2013-11-12 DIAGNOSIS — S199XXA Unspecified injury of neck, initial encounter: Secondary | ICD-10-CM | POA: Diagnosis present

## 2013-11-12 DIAGNOSIS — Z8659 Personal history of other mental and behavioral disorders: Secondary | ICD-10-CM | POA: Insufficient documentation

## 2013-11-12 DIAGNOSIS — S1093XA Contusion of unspecified part of neck, initial encounter: Principal | ICD-10-CM

## 2013-11-12 NOTE — ED Notes (Signed)
From [facility].  unwittnessed fall in the bathroom.  Staff found pt laying in the floor.  Swollen area to right cheek area.

## 2013-11-12 NOTE — ED Notes (Signed)
Patient has large swollen area to right cheek after a fall. Patient has abrasion noted to affected cheek. Patient denies pain at this time. A&OX4. No pain to neck, head or back. Able to move extremities freely.

## 2013-11-12 NOTE — ED Notes (Signed)
[facility] to advise pt ready for discharge. Burna MortimerWanda advises they have no way to pick up patient & requested to return by EMS. EMS called for return transport.

## 2013-11-12 NOTE — ED Notes (Signed)
Patient discharged back home to assisted living via [facility]. Caretaker  Aware of patients discharge conditions and verbalizes of discharge instructions. Patient out of department at this time via [facility]

## 2013-11-12 NOTE — Discharge Instructions (Signed)
CT scan of face reveals a large hematoma. No broken bones. Ice pack. This will take several weeks to improve. Followup your primary care Dr.

## 2013-11-12 NOTE — ED Notes (Signed)
EMS recalled about picking up pt.

## 2013-11-12 NOTE — ED Provider Notes (Signed)
CSN: [id number]     Arrival date & time 11/12/13  8295 History   First MD Initiated Contact with Patient 11/12/13 1907    This chart was scribed for Donnetta Hutching, MD by Marica Otter, ED Scribe. This patient was seen in room APA14/APA14 and the patient's care was started at 7:27 PM. LEVEL 5 CAVEAT FOR DEMENTIA  Chief Complaint  Patient presents with  . Fall   PCP: [name], MD  The history is provided by the nursing home. No language interpreter was used.   HPI Comments: Kendra [name] is a 80 y.o. female, who is a resident of [facility], who presents to the Emergency Department complaining of a fall with associated swelling and 2 lacerations to the right cheek area. Per [facility] staff, they found the pt lying on bathroom floor. No one witnessed pt's fall in the bathroom.   Past Medical History  Diagnosis Date  . Organic brain syndrome   . Hard of hearing    Past Surgical History  Procedure Laterality Date  . Chronic alcohol abuse    . Fracture surgery    . Femur fracture surgery     History reviewed. No pertinent family history. History  Substance Use Topics  . Smoking status: Never Smoker   . Smokeless tobacco: Not on file  . Alcohol Use: No   OB History   Grav Para Term Preterm Abortions TAB SAB Ect Mult Living                 Review of Systems  Unable to perform ROS: Dementia   A complete 10 system review of systems was obtained and all systems are negative except as noted in the HPI and PMH.     Allergies  Review of patient's allergies indicates no known allergies.  Home Medications   Prior to Admission medications   Medication Sig Start Date End Date Taking? Authorizing Provider  aspirin 325 MG tablet Take 325 mg by mouth daily.   Yes Historical Provider, MD  docusate sodium (COLACE) 100 MG capsule Take 100 mg by mouth at bedtime.   Yes Historical Provider, MD  ENSURE (ENSURE) Take 237 mLs by mouth daily.   Yes Historical  Provider, MD  simvastatin (ZOCOR) 20 MG tablet Take 20 mg by mouth every evening.   Yes Historical Provider, MD  thioridazine (MELLARIL) 10 MG tablet Take 10 mg by mouth 2 (two) times daily.   Yes Historical Provider, MD   Triage Vitals: BP 160/70  Pulse 78  Temp(Src) 97.4 F (36.3 C) (Oral)  Resp 16  SpO2 100% Physical Exam  Nursing note and vitals reviewed. HENT:  Head: Normocephalic and atraumatic.  Eyes: Conjunctivae and EOM are normal. Pupils are equal, round, and reactive to light.  Neck: Normal range of motion. Neck supple.  Cardiovascular: Normal rate, regular rhythm and normal heart sounds.   Pulmonary/Chest: Effort normal and breath sounds normal.  Abdominal: Soft. Bowel sounds are normal.  Musculoskeletal: Normal range of motion.  Neurological: She is alert.  Skin: Skin is warm and dry.  hematoma on right cheek 1/2 the size of baseball and 2 central laceration of approxiamtely 3mm and 4mm.    ED Course  Procedures (including critical care time) DIAGNOSTIC STUDIES: Oxygen Saturation is 100% on RA, nl by my interpretation.   Labs Review Labs Reviewed - No data to display  Imaging Review Ct Maxillofacial Wo Cm  11/12/2013   CLINICAL DATA:  Large colon area to right cheek after  fall.  EXAM: CT MAXILLOFACIAL WITHOUT CONTRAST  TECHNIQUE: Multidetector CT imaging of the maxillofacial structures was performed. Multiplanar CT image reconstructions were also generated. A small metallic BB was placed on the right temple in order to reliably differentiate right from left.  COMPARISON:  None.  FINDINGS: The zygomatic arches are intact. The mandible is intact. The temporomandibular joints are located bilaterally. Degenerative changes are seen in the temporomandibular joints bilaterally. No medial or inferior orbital wall fracture on either side. Pterygoid plates are intact. No evidence for maxillary sinus fracture  There is a large 1.9 x 3.3 cm subcutaneous hematoma over the right  anterior zygoma with edema/hemorrhage within the subcutaneous tissues of the right cheek.  IMPRESSION: Large subcutaneous hematoma over the anterior aspect of the right zygoma. No underlying facial fracture.   Electronically Signed   By: Kennith CenterEric  Mansell M.D.   On: 11/12/2013 20:56     EKG Interpretation None      MDM   Final diagnoses:  Facial hematoma, initial encounter    Maxillofacial CT scan reveals a large right subcutaneous hematoma over the anterior aspect of the zygoma. No facial fractures.  I personally performed the services described in this documentation, which was scribed in my presence. The recorded information has been reviewed and is accurate.    Donnetta HutchingBrian Senan Urey, MD 11/12/13 2107

## 2014-02-03 ENCOUNTER — Emergency Department (HOSPITAL_COMMUNITY)
Admission: EM | Admit: 2014-02-03 | Discharge: 2014-02-03 | Disposition: A | Discharge status: Home / Self Care | Payer: Medicare Other | Attending: Emergency Medicine | Admitting: Emergency Medicine

## 2014-02-03 ENCOUNTER — Encounter (HOSPITAL_COMMUNITY): Payer: Self-pay | Admitting: Emergency Medicine

## 2014-02-03 DIAGNOSIS — Z79899 Other long term (current) drug therapy: Secondary | ICD-10-CM | POA: Insufficient documentation

## 2014-02-03 DIAGNOSIS — R55 Syncope and collapse: Secondary | ICD-10-CM | POA: Diagnosis not present

## 2014-02-03 DIAGNOSIS — F0390 Unspecified dementia without behavioral disturbance: Secondary | ICD-10-CM | POA: Insufficient documentation

## 2014-02-03 DIAGNOSIS — N183 Chronic kidney disease, stage 3 (moderate): Secondary | ICD-10-CM | POA: Diagnosis not present

## 2014-02-03 DIAGNOSIS — R400 Somnolence: Secondary | ICD-10-CM | POA: Diagnosis present

## 2014-02-03 DIAGNOSIS — Z7982 Long term (current) use of aspirin: Secondary | ICD-10-CM | POA: Insufficient documentation

## 2014-02-03 DIAGNOSIS — Z9181 History of falling: Secondary | ICD-10-CM | POA: Diagnosis not present

## 2014-02-03 DIAGNOSIS — H9190 Unspecified hearing loss, unspecified ear: Secondary | ICD-10-CM | POA: Insufficient documentation

## 2014-02-03 DIAGNOSIS — E785 Hyperlipidemia, unspecified: Secondary | ICD-10-CM | POA: Diagnosis not present

## 2014-02-03 DIAGNOSIS — Z8781 Personal history of (healed) traumatic fracture: Secondary | ICD-10-CM | POA: Insufficient documentation

## 2014-02-03 DIAGNOSIS — Z8673 Personal history of transient ischemic attack (TIA), and cerebral infarction without residual deficits: Secondary | ICD-10-CM | POA: Diagnosis not present

## 2014-02-03 HISTORY — DX: Cerebral infarction, unspecified: I63.9

## 2014-02-03 HISTORY — DX: Hyperlipidemia, unspecified: E78.5

## 2014-02-03 HISTORY — DX: Alcohol abuse, uncomplicated: F10.10

## 2014-02-03 HISTORY — DX: Chronic kidney disease, stage 3 unspecified: N18.30

## 2014-02-03 HISTORY — DX: Unspecified fall, initial encounter: W19.XXXA

## 2014-02-03 HISTORY — DX: Chronic kidney disease, stage 3 (moderate): N18.3

## 2014-02-03 LAB — CBC WITH DIFFERENTIAL/PLATELET
BASOS PCT: 0 % (ref 0–1)
Basophils Absolute: 0 10*3/uL (ref 0.0–0.1)
EOS ABS: 0 10*3/uL (ref 0.0–0.7)
Eosinophils Relative: 1 % (ref 0–5)
HCT: 31.7 % — ABNORMAL LOW (ref 36.0–46.0)
Hemoglobin: 10.5 g/dL — ABNORMAL LOW (ref 12.0–15.0)
Lymphocytes Relative: 24 % (ref 12–46)
Lymphs Abs: 1.2 10*3/uL (ref 0.7–4.0)
MCH: 30 pg (ref 26.0–34.0)
MCHC: 33.1 g/dL (ref 30.0–36.0)
MCV: 90.6 fL (ref 78.0–100.0)
Monocytes Absolute: 0.4 10*3/uL (ref 0.1–1.0)
Monocytes Relative: 7 % (ref 3–12)
NEUTROS PCT: 68 % (ref 43–77)
Neutro Abs: 3.5 10*3/uL (ref 1.7–7.7)
Platelets: 154 10*3/uL (ref 150–400)
RBC: 3.5 MIL/uL — ABNORMAL LOW (ref 3.87–5.11)
RDW: 13.9 % (ref 11.5–15.5)
WBC: 5.2 10*3/uL (ref 4.0–10.5)

## 2014-02-03 LAB — BASIC METABOLIC PANEL
Anion gap: 13 (ref 5–15)
BUN: 40 mg/dL — ABNORMAL HIGH (ref 6–23)
CALCIUM: 8.3 mg/dL — AB (ref 8.4–10.5)
CO2: 20 mEq/L (ref 19–32)
Chloride: 107 mEq/L (ref 96–112)
Creatinine, Ser: 1.75 mg/dL — ABNORMAL HIGH (ref 0.50–1.10)
GFR, EST AFRICAN AMERICAN: 27 mL/min — AB (ref >90)
GFR, EST NON AFRICAN AMERICAN: 23 mL/min — AB (ref >90)
Glucose, Bld: 103 mg/dL — ABNORMAL HIGH (ref 70–99)
Potassium: 4.6 mEq/L (ref 3.7–5.3)
Sodium: 140 mEq/L (ref 137–147)

## 2014-02-03 NOTE — Discharge Instructions (Signed)
Screening labs and EKGs were normal.    Patient has no complaints here.

## 2014-02-03 NOTE — ED Provider Notes (Signed)
CSN: [id number]     Arrival date & time 02/03/14  1402 History  This chart was scribed for Donnetta HutchingBrian Pamula Luther, MD by Chestine SporeSoijett Blue, ED Scribe. The patient was seen in room APA18/APA18 at 2:37 PM.   Chief Complaint  Patient presents with  . Loss of Consciousness     The history is provided by the EMS personnel. No language interpreter was used.   Kendra [name] is a 80 y.o. female  LEVEL 5 CAVEAT: Dementia Per EMS and Nurses note: pt was on the toilet, the slumped over and was "unconscious for 3-4 minutes". She revived spontaneously. Pt now has no complaints.   Specifically no chest pain, abdominal pain, fever, chills, dysuria, new neurological deficits Past Medical History  Diagnosis Date  . Organic brain syndrome   . Hard of hearing   . Chronic alcohol abuse   . Hyperlipidemia   . Fall   . CKD (chronic kidney disease), stage III   . CVA (cerebral infarction)    Past Surgical History  Procedure Laterality Date  . Chronic alcohol abuse    . Fracture surgery    . Femur fracture surgery     History reviewed. No pertinent family history. History  Substance Use Topics  . Smoking status: Never Smoker   . Smokeless tobacco: Not on file  . Alcohol Use: No   OB History   Grav Para Term Preterm Abortions TAB SAB Ect Mult Living                 Review of Systems  Unable to perform ROS: Dementia  All other systems reviewed and are negative.     Allergies  Review of patient's allergies indicates no known allergies.  Home Medications   Prior to Admission medications   Medication Sig Start Date End Date Taking? Authorizing Provider  aspirin 325 MG tablet Take 325 mg by mouth daily.   Yes Historical Provider, MD  docusate sodium (COLACE) 100 MG capsule Take 100 mg by mouth at bedtime.   Yes Historical Provider, MD  simvastatin (ZOCOR) 20 MG tablet Take 20 mg by mouth every evening.   Yes Historical Provider, MD  thioridazine (MELLARIL) 10 MG tablet Take 10 mg by mouth 2 (two)  times daily.   Yes Historical Provider, MD   BP 159/103  Pulse 82  Temp(Src) 97.9 F (36.6 C) (Oral)  Resp 18  Ht 5\' 1"  (1.549 m)  Wt 95 lb (43.092 kg)  BMI 17.96 kg/m2  SpO2 100% Physical Exam  Nursing note and vitals reviewed. Constitutional: She is oriented to person, place, and time. She appears well-developed and well-nourished.  HENT:  Head: Normocephalic and atraumatic.  Eyes: Conjunctivae and EOM are normal. Pupils are equal, round, and reactive to light.  Neck: Normal range of motion. Neck supple.  Cardiovascular: Normal rate, regular rhythm and normal heart sounds.   Pulmonary/Chest: Effort normal and breath sounds normal.  Abdominal: Soft. Bowel sounds are normal.  Musculoskeletal: Normal range of motion.  Neurological: She is alert and oriented to person, place, and time.  Skin: Skin is warm and dry.  Psychiatric: She has a normal mood and affect. Her behavior is normal.    ED Course  Procedures (including critical care time) DIAGNOSTIC STUDIES: Oxygen Saturation is 100% on room air, normal by my interpretation.    COORDINATION OF CARE: 2:41 PM-Discussed treatment plan which includes labs and UA with pt at bedside and pt agreed to plan.   Labs Review Labs Reviewed  BASIC METABOLIC PANEL - Abnormal; Notable for the following:    Glucose, Bld 103 (*)    BUN 40 (*)    Creatinine, Ser 1.75 (*)    Calcium 8.3 (*)    GFR calc non Af Amer 23 (*)    GFR calc Af Amer 27 (*)    All other components within normal limits  CBC WITH DIFFERENTIAL - Abnormal; Notable for the following:    RBC 3.50 (*)    Hemoglobin 10.5 (*)    HCT 31.7 (*)    All other components within normal limits  URINALYSIS, ROUTINE W REFLEX MICROSCOPIC    Imaging Review No results found.   EKG Interpretation   Date/Time:  Friday February 03 2014 14:25:06 EDT Ventricular Rate:  73 PR Interval:  190 QRS Duration: 129 QT Interval:  453 QTC Calculation: 499 R Axis:   -19 Text  Interpretation:  Sinus rhythm Right bundle branch block Confirmed by  Kamill Fulbright  MD, Lee-Ann Gal ([id number]) on 02/03/2014 2:48:54 PM      MDM   Final diagnoses:  Syncope, unspecified syncope type    Patient is in no acute distress. She has no complaints. Hemoglobin stable. EKG stable. Glucose normal.  Unable to obtain urinalysis  I personally performed the services described in this documentation, which was scribed in my presence. The recorded information has been reviewed and is accurate.    Donnetta HutchingBrian Dermot Gremillion, MD 02/03/14 [phone]

## 2014-02-03 NOTE — ED Notes (Signed)
[facility] with syncopal episode. Per EMS; staff states they found patient on the toilet slumped over and unconscious. States she was unconscious approximately 3-4 minutes. Patient alert in wheelchair when EMS arrived to home. Patient alert at triage.

## 2014-02-06 ENCOUNTER — Encounter (HOSPITAL_COMMUNITY): Payer: Self-pay | Admitting: Emergency Medicine

## 2014-02-06 ENCOUNTER — Emergency Department (HOSPITAL_COMMUNITY): Payer: Medicare Other

## 2014-02-06 ENCOUNTER — Inpatient Hospital Stay (HOSPITAL_COMMUNITY)
Admission: EM | Admit: 2014-02-06 | Discharge: 2014-02-14 | DRG: 551 | Disposition: A | Discharge status: Home Health | Payer: Medicare Other | Attending: Family Medicine | Admitting: Family Medicine

## 2014-02-06 DIAGNOSIS — N189 Chronic kidney disease, unspecified: Secondary | ICD-10-CM

## 2014-02-06 DIAGNOSIS — W19XXXA Unspecified fall, initial encounter: Secondary | ICD-10-CM | POA: Diagnosis present

## 2014-02-06 DIAGNOSIS — J189 Pneumonia, unspecified organism: Secondary | ICD-10-CM | POA: Diagnosis present

## 2014-02-06 DIAGNOSIS — S12120A Other displaced dens fracture, initial encounter for closed fracture: Principal | ICD-10-CM | POA: Diagnosis present

## 2014-02-06 DIAGNOSIS — Y92122 Bedroom in nursing home as the place of occurrence of the external cause: Secondary | ICD-10-CM

## 2014-02-06 DIAGNOSIS — I2510 Atherosclerotic heart disease of native coronary artery without angina pectoris: Secondary | ICD-10-CM | POA: Diagnosis present

## 2014-02-06 DIAGNOSIS — S0181XA Laceration without foreign body of other part of head, initial encounter: Secondary | ICD-10-CM

## 2014-02-06 DIAGNOSIS — H9190 Unspecified hearing loss, unspecified ear: Secondary | ICD-10-CM | POA: Diagnosis present

## 2014-02-06 DIAGNOSIS — F0280 Dementia in other diseases classified elsewhere without behavioral disturbance: Secondary | ICD-10-CM | POA: Diagnosis present

## 2014-02-06 DIAGNOSIS — Z681 Body mass index (BMI) 19 or less, adult: Secondary | ICD-10-CM

## 2014-02-06 DIAGNOSIS — T68XXXA Hypothermia, initial encounter: Secondary | ICD-10-CM | POA: Diagnosis present

## 2014-02-06 DIAGNOSIS — Z8673 Personal history of transient ischemic attack (TIA), and cerebral infarction without residual deficits: Secondary | ICD-10-CM

## 2014-02-06 DIAGNOSIS — I472 Ventricular tachycardia: Secondary | ICD-10-CM | POA: Diagnosis present

## 2014-02-06 DIAGNOSIS — R748 Abnormal levels of other serum enzymes: Secondary | ICD-10-CM | POA: Diagnosis present

## 2014-02-06 DIAGNOSIS — F09 Unspecified mental disorder due to known physiological condition: Secondary | ICD-10-CM | POA: Diagnosis present

## 2014-02-06 DIAGNOSIS — S0003XA Contusion of scalp, initial encounter: Secondary | ICD-10-CM | POA: Diagnosis present

## 2014-02-06 DIAGNOSIS — E785 Hyperlipidemia, unspecified: Secondary | ICD-10-CM | POA: Diagnosis present

## 2014-02-06 DIAGNOSIS — D649 Anemia, unspecified: Secondary | ICD-10-CM | POA: Diagnosis present

## 2014-02-06 DIAGNOSIS — D638 Anemia in other chronic diseases classified elsewhere: Secondary | ICD-10-CM | POA: Diagnosis present

## 2014-02-06 DIAGNOSIS — N183 Chronic kidney disease, stage 3 unspecified: Secondary | ICD-10-CM | POA: Diagnosis present

## 2014-02-06 DIAGNOSIS — S12100A Unspecified displaced fracture of second cervical vertebra, initial encounter for closed fracture: Secondary | ICD-10-CM

## 2014-02-06 DIAGNOSIS — S32000A Wedge compression fracture of unspecified lumbar vertebra, initial encounter for closed fracture: Secondary | ICD-10-CM | POA: Diagnosis present

## 2014-02-06 DIAGNOSIS — E43 Unspecified severe protein-calorie malnutrition: Secondary | ICD-10-CM | POA: Diagnosis present

## 2014-02-06 DIAGNOSIS — S129XXA Fracture of neck, unspecified, initial encounter: Secondary | ICD-10-CM

## 2014-02-06 DIAGNOSIS — E875 Hyperkalemia: Secondary | ICD-10-CM | POA: Diagnosis present

## 2014-02-06 DIAGNOSIS — G309 Alzheimer's disease, unspecified: Secondary | ICD-10-CM | POA: Diagnosis present

## 2014-02-06 DIAGNOSIS — N179 Acute kidney failure, unspecified: Secondary | ICD-10-CM | POA: Diagnosis present

## 2014-02-06 DIAGNOSIS — S01111A Laceration without foreign body of right eyelid and periocular area, initial encounter: Secondary | ICD-10-CM | POA: Diagnosis present

## 2014-02-06 DIAGNOSIS — Z23 Encounter for immunization: Secondary | ICD-10-CM

## 2014-02-06 HISTORY — DX: Wedge compression fracture of unspecified lumbar vertebra, initial encounter for closed fracture: S32.000A

## 2014-02-06 HISTORY — DX: Unspecified dementia, unspecified severity, without behavioral disturbance, psychotic disturbance, mood disturbance, and anxiety: F03.90

## 2014-02-06 LAB — COMPREHENSIVE METABOLIC PANEL
ALBUMIN: 3.4 g/dL — AB (ref 3.5–5.2)
ALK PHOS: 76 U/L (ref 39–117)
ALT: 10 U/L (ref 0–35)
AST: 18 U/L (ref 0–37)
Anion gap: 12 (ref 5–15)
BUN: 34 mg/dL — ABNORMAL HIGH (ref 6–23)
CO2: 22 mEq/L (ref 19–32)
Calcium: 9 mg/dL (ref 8.4–10.5)
Chloride: 108 mEq/L (ref 96–112)
Creatinine, Ser: 1.36 mg/dL — ABNORMAL HIGH (ref 0.50–1.10)
GFR calc Af Amer: 37 mL/min — ABNORMAL LOW (ref >90)
GFR calc non Af Amer: 32 mL/min — ABNORMAL LOW (ref >90)
Glucose, Bld: 114 mg/dL — ABNORMAL HIGH (ref 70–99)
POTASSIUM: 5.9 meq/L — AB (ref 3.7–5.3)
SODIUM: 142 meq/L (ref 137–147)
TOTAL PROTEIN: 6.9 g/dL (ref 6.0–8.3)
Total Bilirubin: 0.3 mg/dL (ref 0.3–1.2)

## 2014-02-06 LAB — CBC WITH DIFFERENTIAL/PLATELET
BASOS ABS: 0 10*3/uL (ref 0.0–0.1)
BASOS PCT: 0 % (ref 0–1)
EOS ABS: 0 10*3/uL (ref 0.0–0.7)
Eosinophils Relative: 0 % (ref 0–5)
HCT: 30.9 % — ABNORMAL LOW (ref 36.0–46.0)
Hemoglobin: 10.5 g/dL — ABNORMAL LOW (ref 12.0–15.0)
Lymphocytes Relative: 15 % (ref 12–46)
Lymphs Abs: 1.5 10*3/uL (ref 0.7–4.0)
MCH: 30.1 pg (ref 26.0–34.0)
MCHC: 34 g/dL (ref 30.0–36.0)
MCV: 88.5 fL (ref 78.0–100.0)
Monocytes Absolute: 0.7 10*3/uL (ref 0.1–1.0)
Monocytes Relative: 7 % (ref 3–12)
NEUTROS PCT: 78 % — AB (ref 43–77)
Neutro Abs: 7.6 10*3/uL (ref 1.7–7.7)
PLATELETS: 157 10*3/uL (ref 150–400)
RBC: 3.49 MIL/uL — AB (ref 3.87–5.11)
RDW: 13.8 % (ref 11.5–15.5)
WBC: 9.7 10*3/uL (ref 4.0–10.5)

## 2014-02-06 LAB — CBG MONITORING, ED: Glucose-Capillary: 117 mg/dL — ABNORMAL HIGH (ref 70–99)

## 2014-02-06 LAB — POTASSIUM: Potassium: 5.7 mEq/L — ABNORMAL HIGH (ref 3.7–5.3)

## 2014-02-06 LAB — CK: Total CK: 209 U/L — ABNORMAL HIGH (ref 7–177)

## 2014-02-06 LAB — RETICULOCYTES
RBC.: 3.2 MIL/uL — AB (ref 3.87–5.11)
RETIC CT PCT: 1.2 % (ref 0.4–3.1)
Retic Count, Absolute: 38.4 10*3/uL (ref 19.0–186.0)

## 2014-02-06 MED ORDER — HYDROCODONE-ACETAMINOPHEN 5-325 MG PO TABS
1.0000 | ORAL_TABLET | ORAL | Status: DC | PRN
  Administered 2014-02-06 – 2014-02-10 (×3): 1 via ORAL
  Filled 2014-02-06 (×3): qty 1

## 2014-02-06 MED ORDER — SODIUM POLYSTYRENE SULFONATE 15 GM/60ML PO SUSP
15.0000 g | Freq: Once | ORAL | Status: AC
  Administered 2014-02-06: 15 g via ORAL
  Filled 2014-02-06: qty 60

## 2014-02-06 MED ORDER — DOCUSATE SODIUM 100 MG PO CAPS
100.0000 mg | ORAL_CAPSULE | Freq: Every day | ORAL | Status: DC
  Administered 2014-02-06 – 2014-02-13 (×8): 100 mg via ORAL
  Filled 2014-02-06 (×8): qty 1

## 2014-02-06 MED ORDER — ALUM & MAG HYDROXIDE-SIMETH 200-200-20 MG/5ML PO SUSP: 30.0000 mL | Freq: Four times a day (QID) | ORAL | Status: DC | PRN

## 2014-02-06 MED ORDER — SODIUM CHLORIDE 0.9 % IV SOLN
INTRAVENOUS | Status: DC
  Administered 2014-02-06: 10:00:00 via INTRAVENOUS

## 2014-02-06 MED ORDER — ONDANSETRON HCL 4 MG PO TABS: 4.0000 mg | ORAL_TABLET | Freq: Four times a day (QID) | ORAL | Status: DC | PRN

## 2014-02-06 MED ORDER — SODIUM CHLORIDE 0.9 % IV BOLUS (SEPSIS): 700.0000 mL | Freq: Once | INTRAVENOUS | Status: DC

## 2014-02-06 MED ORDER — PNEUMOCOCCAL VAC POLYVALENT 25 MCG/0.5ML IJ INJ
0.5000 mL | INJECTION | INTRAMUSCULAR | Status: AC
  Administered 2014-02-07: 0.5 mL via INTRAMUSCULAR
  Filled 2014-02-06: qty 0.5

## 2014-02-06 MED ORDER — INFLUENZA VAC SPLIT QUAD 0.5 ML IM SUSY
0.5000 mL | PREFILLED_SYRINGE | INTRAMUSCULAR | Status: AC
  Administered 2014-02-07: 0.5 mL via INTRAMUSCULAR
  Filled 2014-02-06: qty 0.5

## 2014-02-06 MED ORDER — HEPARIN SODIUM (PORCINE) 5000 UNIT/ML IJ SOLN
5000.0000 [IU] | Freq: Three times a day (TID) | INTRAMUSCULAR | Status: DC
  Administered 2014-02-06 – 2014-02-14 (×23): 5000 [IU] via SUBCUTANEOUS
  Filled 2014-02-06 (×22): qty 1

## 2014-02-06 MED ORDER — ONDANSETRON HCL 4 MG/2ML IJ SOLN
4.0000 mg | Freq: Four times a day (QID) | INTRAMUSCULAR | Status: DC | PRN
Start: 2014-02-06 — End: 2014-02-14

## 2014-02-06 MED ORDER — SODIUM BICARBONATE 8.4 % IV SOLN
INTRAVENOUS | Status: AC
  Filled 2014-02-06: qty 50

## 2014-02-06 MED ORDER — ACETAMINOPHEN 325 MG PO TABS: 650.0000 mg | ORAL_TABLET | Freq: Four times a day (QID) | ORAL | Status: DC | PRN

## 2014-02-06 MED ORDER — SODIUM CHLORIDE 0.9 % IJ SOLN
3.0000 mL | Freq: Two times a day (BID) | INTRAMUSCULAR | Status: DC
  Administered 2014-02-06 – 2014-02-14 (×8): 3 mL via INTRAVENOUS

## 2014-02-06 MED ORDER — HYDROCODONE-ACETAMINOPHEN 5-325 MG PO TABS: 1.0000 | ORAL_TABLET | ORAL | Status: DC | PRN

## 2014-02-06 MED ORDER — BUPIVACAINE HCL (PF) 0.5 % IJ SOLN
10.0000 mL | Freq: Once | INTRAMUSCULAR | Status: DC
  Filled 2014-02-06: qty 30

## 2014-02-06 MED ORDER — ASPIRIN EC 81 MG PO TBEC
81.0000 mg | DELAYED_RELEASE_TABLET | Freq: Every day | ORAL | Status: DC
  Administered 2014-02-09 – 2014-02-14 (×6): 81 mg via ORAL
  Filled 2014-02-06 (×8): qty 1

## 2014-02-06 MED ORDER — ACETAMINOPHEN 650 MG RE SUPP: 650.0000 mg | Freq: Four times a day (QID) | RECTAL | Status: DC | PRN

## 2014-02-06 MED ORDER — SIMVASTATIN 20 MG PO TABS
20.0000 mg | ORAL_TABLET | Freq: Every evening | ORAL | Status: DC
  Administered 2014-02-06 – 2014-02-07 (×2): 20 mg via ORAL
  Filled 2014-02-06 (×2): qty 1

## 2014-02-06 MED ORDER — SODIUM CHLORIDE 0.9 % IV SOLN
INTRAVENOUS | Status: DC
  Administered 2014-02-06: 18:00:00 via INTRAVENOUS

## 2014-02-06 MED ORDER — ENSURE PUDDING PO PUDG
1.0000 | Freq: Three times a day (TID) | ORAL | Status: DC
  Administered 2014-02-06 – 2014-02-14 (×21): 1 via ORAL

## 2014-02-06 MED ORDER — SODIUM BICARBONATE 8.4 % IV SOLN
INTRAVENOUS | Status: AC
  Administered 2014-02-06 – 2014-02-07 (×2): via INTRAVENOUS
  Filled 2014-02-06 (×2): qty 1000

## 2014-02-06 MED ORDER — ASPIRIN 325 MG PO TABS
325.0000 mg | ORAL_TABLET | Freq: Every day | ORAL | Status: DC
  Administered 2014-02-06: 325 mg via ORAL
  Filled 2014-02-06: qty 1

## 2014-02-06 MED ORDER — LIDOCAINE-EPINEPHRINE-TETRACAINE (LET) SOLUTION
3.0000 mL | Freq: Once | NASAL | Status: AC
  Administered 2014-02-06: 3 mL via TOPICAL
  Filled 2014-02-06: qty 3

## 2014-02-06 MED ORDER — LEVOFLOXACIN IN D5W 750 MG/150ML IV SOLN
750.0000 mg | INTRAVENOUS | Status: DC
  Administered 2014-02-06: 750 mg via INTRAVENOUS
  Filled 2014-02-06: qty 150

## 2014-02-06 MED ORDER — THIORIDAZINE HCL 10 MG PO TABS
10.0000 mg | ORAL_TABLET | Freq: Two times a day (BID) | ORAL | Status: DC
  Administered 2014-02-06 – 2014-02-14 (×16): 10 mg via ORAL
  Filled 2014-02-06 (×20): qty 1

## 2014-02-06 MED ORDER — LEVOFLOXACIN IN D5W 500 MG/100ML IV SOLN
500.0000 mg | INTRAVENOUS | Status: DC
  Administered 2014-02-08: 500 mg via INTRAVENOUS
  Filled 2014-02-06 (×3): qty 100

## 2014-02-06 NOTE — ED Notes (Signed)
Pt caregiver had to leave and left contact information. Pt caregiver, Angelena FormDinisha, contact information [phone]. Pt caregiver requesting updates/room assignment.

## 2014-02-06 NOTE — ED Notes (Addendum)
Per EMS, pt found in the floor per caregiver. CBG en route 115. Pt alert but nonverbal at time of arrival. No active bleeding noted. Dried blood noted to bilateral nares. Contusion noted to bilateral eyes, moderate laceration noted in between eyebrows.  EMS immobilized c-spine with towels and tape.

## 2014-02-06 NOTE — ED Notes (Signed)
EDP admin dermabond to pt laceration.

## 2014-02-06 NOTE — ED Notes (Signed)
EDP aware that not able to obtain foley. EDP reported to bladder scan pt to assess amount of urine in the bladder,if urine found attempt in and out cath. EDP reported okay to assess temperature rectally and maintain bair hugger.

## 2014-02-06 NOTE — H&P (Signed)
[facility] History and Physical  Kendra ChaseMattie E Deliz WUJ:[id number] DOB: 29-May-1915 DOA: 02/06/2014  Referring physician:  PCP: Alice ReichertMCINNIS,ANGUS G, MD   Chief Complaint: fall  HPI: Kendra [name] is a very pleasant 80 y.o. female who presents to ED with chief complaint fall. EMS reported patient fell at nursing facility at unknown time. Initial evaluation reveal C2 fracture, hyperkalemia, acute on chronic kidney failure and hypothermia.  Public relations account executiveacility administrator at bedside providing information due to patients dementia. reports patient found on floor this am. Reported patient not usually ambulating without assist. Patient "put to bed last night around 11pm and must have tried to get up to go to BR". EMS reports congealed blood at scene. There was no report of recent illness, fever, vomiting, diarrhea.   Work up in ED includes CMEt significant for potassium 5.9, creatinine 1.36, serum glucose 114, complete blood count significant for Hg 10.5. CT of head yields right frontal scalp hematoma. No evidence of intracranial trauma. Concern for mandibular rami fractures bilaterally and more prominent on the left. There is significant motion artifact through this region. Acute fracture of the C2 body of the dens as well as the neural arch of C2 on the left and right. Xray of left shoulder yields abnormal appearance of left shoulder most suggestive of chronic anterior subluxation of the humeral head and chronic rotator cuff tear. Negative for fracture. Pelvic xray without acute fracture.  Chest xray with coarse left infrahilar lung markings which may reflect  atelectasis or pneumonia. There is no evidence of CHF. There are degenerative changes of the right shoulder with superior migration of the humeral head. This is only partially included in the field of view. There is soft tissue fullness in the right paratracheal region. This is of uncertain etiology and was not visible on the previous study which  was markedly different in positioning. When the patient can tolerate the procedure, a well-positioned PA chest x-ray would be useful. Alternatively, CT scanning of the chest could be considered. EKG with SR and RBBB.   In the ED she is given IV fluids and warming blankets.  Case discussed with neurosurgeon Dr Lovell SheehanJenkins who recommends apen collar and follow up in 1 month.   At time of my exam she has stable BP and HR with rectal temp of 98.5.    Review of Systems:  10 point review of systems complete with facility administrator and all are negative except as indicated in HPI Past Medical History  Diagnosis Date  . Organic brain syndrome   . Hard of hearing   . Chronic alcohol abuse   . Hyperlipidemia   . Fall   . CKD (chronic kidney disease), stage III   . CVA (cerebral infarction)   . Dementia   . Compression fx, lumbar spine     L1 and L2 2015   Past Surgical History  Procedure Laterality Date  . Chronic alcohol abuse    . Fracture surgery    . Femur fracture surgery     Social History:  reports that she has never smoked. She does not have any smokeless tobacco history on file. She reports that she does not drink alcohol or use illicit drugs. Lives at [facility] and has for almost 20 years No Known Allergies  History reviewed. No pertinent family history. unknown at this time due to dementia  Prior to Admission medications   Medication Sig Start Date End Date Taking? Authorizing Provider  aspirin 325 MG tablet Take 325 mg  by mouth daily.   Yes Historical Provider, MD  docusate sodium (COLACE) 100 MG capsule Take 100 mg by mouth at bedtime.   Yes Historical Provider, MD  simvastatin (ZOCOR) 20 MG tablet Take 20 mg by mouth every evening.   Yes Historical Provider, MD  thioridazine (MELLARIL) 10 MG tablet Take 10 mg by mouth 2 (two) times daily.   Yes Historical Provider, MD   Physical Exam: Filed Vitals:   02/06/14 1315 02/06/14 1341 02/06/14 1432 02/06/14 1550    BP:  118/72 144/87   Pulse: 69 76 79   Temp:  96.2 F (35.7 C)    TempSrc:  Rectal    Resp: 15 16 15    Height:    5\' 4"  (1.626 m)  Weight:    45 kg (99 lb 3.3 oz)  SpO2: 100% 100% 100%     Wt Readings from Last 3 Encounters:  02/06/14 45 kg (99 lb 3.3 oz)  02/03/14 43.092 kg (95 lb)  08/29/13 43.4 kg (95 lb 10.9 oz)    General:  Appears calm and comfortable, very frail Eyes: PERRL, normal lids, irises & conjunctiva, laceration above right eyebrow. Bruising of right upper and lower lids, dried blood around nose ENT: very HOH dried blood Neck: Aspen collar intact Cardiovascular: RRR, no m/r/g. No LE edema.  Respiratory: CTA bilaterally, no w/r/r. Normal respiratory effort. Somewhat shallow Abdomen: soft, ntnd +BS no guarding Skin: no rash or induration seen on limited exam Musculoskeletal: grossly normal tone BUE/BLE Psychiatric: cooperative, limited conversation.  Neurologic: alert attempts to follow commands, speech slow but clear          Labs on Admission:  Basic Metabolic Panel:  Recent Labs Lab 02/03/14 1512 02/06/14 0835 02/06/14 1437  NA 140 142  --   K 4.6 5.9* 5.7*  CL 107 108  --   CO2 20 22  --   GLUCOSE 103* 114*  --   BUN 40* 34*  --   CREATININE 1.75* 1.36*  --   CALCIUM 8.3* 9.0  --    Liver Function Tests:  Recent Labs Lab 02/06/14 0835  AST 18  ALT 10  ALKPHOS 76  BILITOT 0.3  PROT 6.9  ALBUMIN 3.4*   No results found for this basename: LIPASE, AMYLASE,  in the last 168 hours No results found for this basename: AMMONIA,  in the last 168 hours CBC:  Recent Labs Lab 02/03/14 1512 02/06/14 0835  WBC 5.2 9.7  NEUTROABS 3.5 7.6  HGB 10.5* 10.5*  HCT 31.7* 30.9*  MCV 90.6 88.5  PLT 154 157   Cardiac Enzymes:  Recent Labs Lab 02/06/14 0835  CKTOTAL 209*    BNP (last 3 results) No results found for this basename: PROBNP,  in the last 8760 hours CBG:  Recent Labs Lab 02/06/14 0817  GLUCAP 117*    Radiological Exams  on Admission: Dg Pelvis 1-2 Views  02/06/2014   CLINICAL DATA:  Status post fall last night. Patient found down. Initial encounter.  EXAM: PELVIS - 1-2 VIEW  COMPARISON:  Plain films of the hips 08/29/2013.  FINDINGS: No acute bony or joint abnormality is identified. Intra medullary nail fixing an old healed left femur fracture is unchanged in appearance.  IMPRESSION: No acute abnormality.   Electronically Signed   By: Drusilla Kanner M.D.   On: 02/06/2014 09:32   Ct Head Wo Contrast  02/06/2014   CLINICAL DATA:  Fall during the night found on floor of nursing facility on her  right side; laceration to rt side of face and nose  EXAM: CT HEAD WITHOUT CONTRAST  CT MAXILLOFACIAL WITHOUT CONTRAST  CT CERVICAL SPINE WITHOUT CONTRAST  TECHNIQUE: Multidetector CT imaging of the head, cervical spine, and maxillofacial structures were performed using the standard protocol without intravenous contrast. Multiplanar CT image reconstructions of the cervical spine and maxillofacial structures were also generated.  COMPARISON:  Head CT 11/12/2013.  FINDINGS: CT HEAD FINDINGS  No intracranial hemorrhage. No parenchymal contusion. No midline shift or mass effect. Basilar cisterns are patent. No skull base fracture. No fluid in the paranasal sinuses or mastoid air cells. Orbits are normal.  Remote infarction in the left cerebellum. Generalized cortical atrophy. This extensive periventricular and subcortical white matter hypodensities.  There is scalp hematoma midline over the right frontal bone. No associated skull fracture.  There is fluid in the right maxillary sinus.  CT MAXILLOFACIAL FINDINGS  Right frontal scalp hematoma. No evidence of orbital fracture. Zygomatic arches are intact. No maxillary bone fracture. Pterygoid plates are intact. Smaller fluid in the right maxillary sinus. Mandibular condyles are located. There is motion artifact which blurs the mandible. Repeat images performed through the mandible with  persistent abnormality. There is step-off of the mandibular bodies bilaterally and more prominent on the left.  On the coronal images air are system and interbody difficult cell this is motion artifact or fracture.  CT CERVICAL SPINE FINDINGS  There is a fracture through the midbody of the dens with posterior displacement of the upper dens by 5 mm. There are nondisplaced vertical fractures through the left and right neural arch of C2 seen on image 16, series 16. No epidural hematoma. No significant prevertebral soft tissue swelling. The C3 through T1 vertebral bodies are normal without evidence of fracture or subluxation. No epidural paraspinal hematoma.  IMPRESSION: 1. Right frontal scalp hematoma. 2. No evidence of intracranial trauma. 3. Concern for mandibular rami fractures bilaterally and more prominent on the left. There is significant motion artifact through this region. Recommend clinical correlation. 4. Acute fracture of the C2 body of the dens as well as the neural arch of C2 on the left and right. Findings conveyed toIVA KNAPP on 02/06/2014  at09:48.   Electronically Signed   By: Genevive Bi M.D.   On: 02/06/2014 09:48   Ct Cervical Spine Wo Contrast  02/06/2014   CLINICAL DATA:  Fall during the night found on floor of nursing facility on her right side; laceration to rt side of face and nose  EXAM: CT HEAD WITHOUT CONTRAST  CT MAXILLOFACIAL WITHOUT CONTRAST  CT CERVICAL SPINE WITHOUT CONTRAST  TECHNIQUE: Multidetector CT imaging of the head, cervical spine, and maxillofacial structures were performed using the standard protocol without intravenous contrast. Multiplanar CT image reconstructions of the cervical spine and maxillofacial structures were also generated.  COMPARISON:  Head CT 11/12/2013.  FINDINGS: CT HEAD FINDINGS  No intracranial hemorrhage. No parenchymal contusion. No midline shift or mass effect. Basilar cisterns are patent. No skull base fracture. No fluid in the paranasal  sinuses or mastoid air cells. Orbits are normal.  Remote infarction in the left cerebellum. Generalized cortical atrophy. This extensive periventricular and subcortical white matter hypodensities.  There is scalp hematoma midline over the right frontal bone. No associated skull fracture.  There is fluid in the right maxillary sinus.  CT MAXILLOFACIAL FINDINGS  Right frontal scalp hematoma. No evidence of orbital fracture. Zygomatic arches are intact. No maxillary bone fracture. Pterygoid plates are intact. Smaller fluid in  the right maxillary sinus. Mandibular condyles are located. There is motion artifact which blurs the mandible. Repeat images performed through the mandible with persistent abnormality. There is step-off of the mandibular bodies bilaterally and more prominent on the left.  On the coronal images air are system and interbody difficult cell this is motion artifact or fracture.  CT CERVICAL SPINE FINDINGS  There is a fracture through the midbody of the dens with posterior displacement of the upper dens by 5 mm. There are nondisplaced vertical fractures through the left and right neural arch of C2 seen on image 16, series 16. No epidural hematoma. No significant prevertebral soft tissue swelling. The C3 through T1 vertebral bodies are normal without evidence of fracture or subluxation. No epidural paraspinal hematoma.  IMPRESSION: 1. Right frontal scalp hematoma. 2. No evidence of intracranial trauma. 3. Concern for mandibular rami fractures bilaterally and more prominent on the left. There is significant motion artifact through this region. Recommend clinical correlation. 4. Acute fracture of the C2 body of the dens as well as the neural arch of C2 on the left and right. Findings conveyed toIVA KNAPP on 02/06/2014  at09:48.   Electronically Signed   By: Genevive Bi M.D.   On: 02/06/2014 09:48   Dg Chest Portable 1 View  02/06/2014   CLINICAL DATA:  Unwitnessed fall last night with patient  found on floor this morning with head laceration; history of previous CVA, organic brain syndrome, and chronic alcohol abuse.  EXAM: PORTABLE CHEST - 1 VIEW  COMPARISON:  Limited views of the chest from any thoracic spine series of Aug 29, 2013  FINDINGS: The lungs are adequately inflated. There are coarse infrahilar lung markings on the left. The cardiac silhouette is top-normal in size. The pulmonary vascularity is not engorged. There is mild tortuosity of the descending thoracic aorta. There is mild soft tissue fullness in the upper right paratracheal region which may be accentuated by patient rotation. There is no pleural effusion. There is superior positioning of the right humeral head with respect to the glenoid which may reflect previous rotator cuff arthropathy.  IMPRESSION: 1. There are coarse left infrahilar lung markings which may reflect atelectasis or pneumonia. There is no evidence of CHF. 2. There are degenerative changes of the right shoulder with superior migration of the humeral head. This is only partially included in the field of view. 3. There is soft tissue fullness in the right paratracheal region. This is of uncertain etiology and was not visible on the previous study which was markedly different in positioning. When the patient can tolerate the procedure, a well-positioned PA chest x-ray would be useful. Alternatively, CT scanning of the chest could be considered.   Electronically Signed   By: David  [place]   On: 02/06/2014 15:39   Dg Shoulder Left Port  02/06/2014   CLINICAL DATA:  Status post fall this morning. Left shoulder pain. Initial encounter.  EXAM: LEFT SHOULDER - 1 VIEW  COMPARISON:  None.  FINDINGS: The humeral head is high-riding consistent with chronic rotator cuff tear. The humeral head appears anteriorly subluxed on the Y-view. There is sclerosis of the articular surface the humeral head and adjacent coracoid process suggesting chronic change. No fracture is identified.  Mild acromioclavicular degenerative change is noted.  IMPRESSION: Abnormal appearance of left shoulder most suggestive of chronic anterior subluxation of the humeral head and chronic rotator cuff tear.  Negative for fracture.   Electronically Signed   By: Drusilla Kanner M.D.  On: 02/06/2014 12:48   Ct Maxillofacial Wo Cm  02/06/2014   CLINICAL DATA:  Fall during the night found on floor of nursing facility on her right side; laceration to rt side of face and nose  EXAM: CT HEAD WITHOUT CONTRAST  CT MAXILLOFACIAL WITHOUT CONTRAST  CT CERVICAL SPINE WITHOUT CONTRAST  TECHNIQUE: Multidetector CT imaging of the head, cervical spine, and maxillofacial structures were performed using the standard protocol without intravenous contrast. Multiplanar CT image reconstructions of the cervical spine and maxillofacial structures were also generated.  COMPARISON:  Head CT 11/12/2013.  FINDINGS: CT HEAD FINDINGS  No intracranial hemorrhage. No parenchymal contusion. No midline shift or mass effect. Basilar cisterns are patent. No skull base fracture. No fluid in the paranasal sinuses or mastoid air cells. Orbits are normal.  Remote infarction in the left cerebellum. Generalized cortical atrophy. This extensive periventricular and subcortical white matter hypodensities.  There is scalp hematoma midline over the right frontal bone. No associated skull fracture.  There is fluid in the right maxillary sinus.  CT MAXILLOFACIAL FINDINGS  Right frontal scalp hematoma. No evidence of orbital fracture. Zygomatic arches are intact. No maxillary bone fracture. Pterygoid plates are intact. Smaller fluid in the right maxillary sinus. Mandibular condyles are located. There is motion artifact which blurs the mandible. Repeat images performed through the mandible with persistent abnormality. There is step-off of the mandibular bodies bilaterally and more prominent on the left.  On the coronal images air are system and interbody difficult  cell this is motion artifact or fracture.  CT CERVICAL SPINE FINDINGS  There is a fracture through the midbody of the dens with posterior displacement of the upper dens by 5 mm. There are nondisplaced vertical fractures through the left and right neural arch of C2 seen on image 16, series 16. No epidural hematoma. No significant prevertebral soft tissue swelling. The C3 through T1 vertebral bodies are normal without evidence of fracture or subluxation. No epidural paraspinal hematoma.  IMPRESSION: 1. Right frontal scalp hematoma. 2. No evidence of intracranial trauma. 3. Concern for mandibular rami fractures bilaterally and more prominent on the left. There is significant motion artifact through this region. Recommend clinical correlation. 4. Acute fracture of the C2 body of the dens as well as the neural arch of C2 on the left and right. Findings conveyed toIVA KNAPP on 02/06/2014  at09:48.   Electronically Signed   By: Genevive BiStewart  Edmunds M.D.   On: 02/06/2014 09:48    EKG  Assessment/Plan Principal Problem:   Hypothermia: Improving with warming blanket. likely related to lying on floor for many hours but is some concern for possible pneumonia on xray. No leukocytosis, no tachycardia and BP stable. She is not hypoxic.  Await urinalysis. Consider empiric antibiotics. Monitor closely. Active Problems: Hyperkalemia: in patient with CKD and decreased po intake due to fall. Will gently hydrate. Will hold nephrotoxins. Will give kayexalate. Monitor on tele.  Recheck in am.   Elevated CK: unclear how long she was on floor but blood had congealed. Reportedly patient unaware that she was on floor when found. Will hydrate with IV fluids and recheck in am. Monitor intake and output. Patient incontinent of urine and bowel most of time according to facility staff.   C2 cervical fracture: case discussed with Dr Lovell SheehanJenkins [place]surgery [place] who recommended aspen collar and follow up with him in 1 month  Anemia:  appears to be related to chronic disease. Current Hg at baseline. Will check anemia panel and FOBT.  CKD (chronic kidney disease), stage III: creatinine appears to be at baseline. Will monitor urine output  Hyperlipidemia: obtain lipid panel      Protein-calorie malnutrition, severe: BMI 17.1. Will request nutritional consult.       Organic brain syndrome: reportedly currently at baseline. Follow commands        Compression fx, lumbar spine: noted 08/2013 discharge summary thought to be chronic. Reportedly pt ambulates with 1 assist but stays in chair most of time.   Hx CVA: chart review indicates left acute cerebral infarct, multiple lacunar infarcts and occlusive intracranial disease in  08/2013. On zocor and aspirin     Code Status: full DVT Prophylaxis: Family Communication: facility administrator at bedside Disposition Plan: back to facility  Time spent: 70 minutes  [facility] Pager [phone]

## 2014-02-06 NOTE — Progress Notes (Signed)
Stafff attempted to do in and out cath of patient without success, MD aware.

## 2014-02-06 NOTE — ED Notes (Signed)
EDP aware of pt rectal temp. Give verbal order for bair hugger and temp foley.

## 2014-02-06 NOTE — ED Notes (Signed)
CBG 117 

## 2014-02-06 NOTE — ED Notes (Signed)
Attempted to obtain urine sample and insert foley x3 attempts with no success. Pt taken to xray. Will notify doctor while pt in xray. Bair hugger initiated at 815.

## 2014-02-06 NOTE — ED Notes (Addendum)
Bladder scanned volume 126. Rectal Temp 90.1. EDP aware of results. EDP reported to infuse warm fluids and to attempt in and out cath. Pt intermittently voiding at time of re-assessment. Still unable to obtain in and out cath sample. Pt more alert and interactive at time of in and out attempt.nad noted. EDP aware.   EDP also reported to place c-collar. Aspen Ped c-collar placed.  Attempted to clean pt skin and remove dried blood from pt hair, nares. Minimal success noted.  Upon cleaning site.Laceration noted to right eyebrow. No active bleeding. EDP aware that suture supplies and suture cart at bedside. EDP reported to pull dermabond as well.

## 2014-02-06 NOTE — H&P (Signed)
The patient was seen and examined. Her chart, vital signs, and laboratory studies were reviewed. She was discussed with nurse practitioner, Ms. black. Agree with her assessment and plan and with additions below:  Brief history: This is a 80 year old woman with a history of acute cellular stroke in May 2015, stage III chronic kidney disease, and Alzheimer's dementia. She presents to the emergency department after being found at the assisted living facility on the floor in her room. It is uncertain how long she had been laying on the floor. However, EMS reported that she had a facial/scalp laceration with congealed blood in her hair. In the ED, she was hypotensive with a temperature of 90 to 110F. Otherwise, her blood pressure and her heart rate and her oxygen saturations were within normal limits. Radiographic studies reviewed and were notable for a right frontal scalp hematoma and concern for mandibular rami fractures and an acute fracture of the C2 body.  Notable exam: The patient is alert and talkative. She is oriented to herself. She is not quite sure she is in the hospital. Head/scalp: Congealed blood on the right side of her scalp and in her head. Laceration above right eyebrow with sealant in place. Dried blood over the right side of her face. Right periorbital ecchymosis and mild surrounding oozing of blood. Right conjunctiva, mildly injected. Neck with brace in place. Lungs clear to auscultation anteriorly. Heart with S1, S2, and no murmurs rubs or gallops. Extremities with no pedal edema. Neurologic: She is talkative and has no obvious facial droop or dysarthria, but exam somewhat limited.  Assessment and plan:  -Hypothermia, likely secondary to low ambient temperatures associated with fall and laying on the floor for unknown amount of time. Her temperature has improved with a warming blanket. Given that there is questionable infiltrates on the chest x-ray, will favor starting Levaquin empirically.  Nursing staff has been unsuccessful at obtaining a urine specimen because of the patient's anatomy. -Facial laceration/periorbital ecchymosis. Status post Dermabond sealant over the right brow laceration by ED physician. Recommend wound care with saline cleansing and monitoring. Will hold aspirin today and restart at a lower dose of 81 mg in 48 hours.  -Hyperkalemia. This is likely secondary to chronic kidney disease in the setting of probable poor intake. Kayexalate ordered. We'll change IV fluids to half-normal saline with bicarbonate added overnight. Continue to monitor. -C2 cervical fracture. The ED physician, Dr. Lynelle DoctorKnapp discussed the patient with neurosurgeon Dr. Lovell SheehanJenkins. He recommended applying an Aspen collar rather than surgery. He would like for her to followup in his office in one month. We'll order fall precautions. -Possible mandibular fracture. We'll change her diet to dysphagia 2 diet and continue to monitor. Consider re-scanning her with a followup maxillofacial CT and if it shows  a convincing mandibular fracture, would recommend consultation with ENT or oral surgery. -Stage III chronic kidney disease. Her creatinine appears to be at its baseline.

## 2014-02-06 NOTE — ED Provider Notes (Signed)
CSN: [id number]     Arrival date & time 02/06/14  4098 History  This chart was scribed for Ward Givens, MD by Karle Plumber, ED Scribe. This patient was seen in room APA04/APA04 and the patient's care was started at 7:48 AM.   Chief Complaint  Patient presents with  . Fall   Patient is a 80 y.o. female presenting with fall. The history is provided by the EMS personnel. No language interpreter was used.  Fall   LEVEL 5 CAVEAT- Full history could not be obtained due to dementia.  HPI Comments:  Kendra [name] is a 80 y.o. female brought in by EMS, who presents to the Emergency Department complaining of a fall. EMS reports that the pt lives in a nursing facility and fell at some point during the night several hours ago and was found lying on her right side. She was found with her head underneath the bed of her roommate. He reports pt has lost control of her bowels and bladder sometime but is unsure if this is baseline. Reports pt has a laceration to her face and was bleeding from her nose as well. He reports the blood had congealed. EMS reports her only statement was "I ain't on no floor" when she was asked how long she had been down. He states the nursing facility was not able to give much information on the patient except that she is very [facility] and is usually nonverbal.  PCP Dr Renard Matter  Past Medical History  Diagnosis Date  . Organic brain syndrome   . Hard of hearing   . Chronic alcohol abuse   . Hyperlipidemia   . Fall   . CKD (chronic kidney disease), stage III   . CVA (cerebral infarction)   . Dementia   . Compression fx, lumbar spine     L1 and L2 2015   Past Surgical History  Procedure Laterality Date  . Chronic alcohol abuse    . Fracture surgery    . Femur fracture surgery     History reviewed. No pertinent family history. History  Substance Use Topics  . Smoking status: Never Smoker   . Smokeless tobacco: Not on file  . Alcohol Use: No   Lives in group  home Informed pt is normally very HOH and nonverbal per the staff  OB History   Grav Para Term Preterm Abortions TAB SAB Ect Mult Living                 Review of Systems  Unable to perform ROS: Patient nonverbal    LEVEL 5 CAVEAT- Full history could not be obtained due to dementia.  Allergies  Review of patient's allergies indicates no known allergies.  Home Medications   Prior to Admission medications   Medication Sig Start Date End Date Taking? Authorizing Provider  aspirin 325 MG tablet Take 325 mg by mouth daily.   Yes Historical Provider, MD  docusate sodium (COLACE) 100 MG capsule Take 100 mg by mouth at bedtime.   Yes Historical Provider, MD  simvastatin (ZOCOR) 20 MG tablet Take 20 mg by mouth every evening.   Yes Historical Provider, MD  thioridazine (MELLARIL) 10 MG tablet Take 10 mg by mouth 2 (two) times daily.   Yes Historical Provider, MD   Triage Vitals: BP 154/120  Pulse 118  Temp(Src) 92 F (33.3 C) (Rectal)  Resp 16  Ht 5\' 4"  (1.626 m)  Wt 95 lb (43.092 kg)  BMI 16.30 kg/m2  SpO2 100%  Vital signs normal except for tachycardia, hypertension, hypothermia  Physical Exam  Nursing note and vitals reviewed. Constitutional:  Non-toxic appearance. She does not appear ill. No distress.  Frail elderly female, removed from back board during exam.   HENT:  Head: Normocephalic.  Right Ear: External ear normal.  Left Ear: External ear normal.  Nose: No mucosal edema or rhinorrhea.  Mouth/Throat: Oropharynx is clear and moist and mucous membranes are normal. No dental abscesses or uvula swelling.  Dried blood in nose.1.5 cm laceration in medial right eyebrow. Bruising of right upper and lower eyelid. Bruising of medial left eye lids  Eyes: Conjunctivae and EOM are normal. Pupils are equal, round, and reactive to light.  Neck: Full passive range of motion without pain.  Pt's neck supported by towels, EMS could not place pediatric or adult C Collar   Cardiovascular: Normal rate, regular rhythm and normal heart sounds.  Exam reveals no gallop and no friction rub.   No murmur heard. Pulmonary/Chest: Effort normal and breath sounds normal. No respiratory distress. She has no wheezes. She has no rhonchi. She has no rales. She exhibits no tenderness and no crepitus.  Abdominal: Soft. Normal appearance and bowel sounds are normal. She exhibits no distension. There is no tenderness. There is no rebound and no guarding.  Musculoskeletal: Normal range of motion. She exhibits no edema and no tenderness.  Has her legs crossed, no obvious deformity, has limited ROM of extremities due to rigidity  Neurological: She is alert. She has normal strength. No cranial nerve deficit.  Skin: Skin is warm, dry and intact. No rash noted. No erythema. No pallor.  . Skin cool to touch.  Psychiatric: Her speech is normal and behavior is normal. Her mood appears not anxious.    ED Course  Procedures (including critical care time)  Medications  0.9 %  sodium chloride infusion ( Intravenous New Bag/Given 02/06/14 1736)  aspirin tablet 325 mg (325 mg Oral Given 02/06/14 1733)  docusate sodium (COLACE) capsule 100 mg (not administered)  simvastatin (ZOCOR) tablet 20 mg (20 mg Oral Given 02/06/14 1733)  thioridazine (MELLARIL) tablet 10 mg (not administered)  heparin injection 5,000 Units (5,000 Units Subcutaneous Given 02/06/14 1733)  sodium chloride 0.9 % injection 3 mL (3 mLs Intravenous Given 02/06/14 1734)  acetaminophen (TYLENOL) tablet 650 mg (not administered)    Or  acetaminophen (TYLENOL) suppository 650 mg (not administered)  HYDROcodone-acetaminophen (NORCO/VICODIN) 5-325 MG per tablet 1-2 tablet (not administered)  ondansetron (ZOFRAN) tablet 4 mg (not administered)    Or  ondansetron (ZOFRAN) injection 4 mg (not administered)  alum & mag hydroxide-simeth (MAALOX/MYLANTA) 200-200-20 MG/5ML suspension 30 mL (not administered)   lidocaine-EPINEPHrine-tetracaine (LET) solution (3 mLs Topical Given 02/06/14 1021)  sodium polystyrene (KAYEXALATE) 15 GM/60ML suspension 15 g (15 g Oral Given 02/06/14 1733)    DIAGNOSTIC STUDIES: Oxygen Saturation is 100% on RA, normal by my interpretation.   COORDINATION OF CARE: 7:57 AM- Will order blood work, urinalysis and imaging. Pt verbalizes understanding and agrees to plan.  08:00Patient's rectal temp is 92. She was placed in a bear hugger and a temp Foley was attempted however nursing staff was unable to place it. They also had great difficulty trying to get a in and out catheter urinalysis on her. They report its due to her anatomy.Marland Kitchen  9:48 AM- Radiologist called in CT results. He reports a dens fracture with fracture of the posterior elements. He also is questioning whether she has a mandible fracture. Patient however does  not appear to have injury at that site. Patient was placed in aspen collar.  9:49 AM- Home care administrator at bedside reports pt is a little more "fiesty" than normal but did verbalize that she was glad she was here at the hospital. Administrator has known pt for 20 years.  11:19 Pt discussed with Dr Lovell SheehanJenkins, neurosurgery who has reviewed her CT scan. States to leave in aspen collar, states these fractures do not heal well, even with surgery. He can recheck in 1 month.   Patient's temperature continued to improve with the bear hugger and warm blankets.  14:22 Dr Sherrie MustacheFisher wants repeat K, and chest xray. Admit to tele, Dr Renard MatterMcInnis attending.  LACERATION REPAIR PROCEDURE NOTE The patient's identification was confirmed and consent was obtained. This procedure was performed by Ward GivensIva L Markese Bloxham, MD at 11:38 AM. Site: right medial eyebrow Sterile procedures observed Anesthetic used (type and amt): LET solution applied Suture type/size: DermaBond Length: 1.5 cm # of Sutures: DermaBond Technique: DermaBond Complexity: simple  Site anesthetized with LET, cleaned  with NS, explored without evidence of foreign body, wound well approximated, site covered with dry, sterile dressing.  Patient tolerated procedure well without complications. Instructions for care discussed verbally and patient provided with additional written instructions for homecare and f/u.  Labs Review Results for orders placed during the hospital encounter of 02/06/14  CBC WITH DIFFERENTIAL      Result Value Ref Range   WBC 9.7  4.0 - 10.5 K/uL   RBC 3.49 (*) 3.87 - 5.11 MIL/uL   Hemoglobin 10.5 (*) 12.0 - 15.0 g/dL   HCT 16.130.9 (*) 09.636.0 - 04.546.0 %   MCV 88.5  78.0 - 100.0 fL   MCH 30.1  26.0 - 34.0 pg   MCHC 34.0  30.0 - 36.0 g/dL   RDW 40.913.8  81.111.5 - 91.415.5 %   Platelets 157  150 - 400 K/uL   Neutrophils Relative % 78 (*) 43 - 77 %   Neutro Abs 7.6  1.7 - 7.7 K/uL   Lymphocytes Relative 15  12 - 46 %   Lymphs Abs 1.5  0.7 - 4.0 K/uL   Monocytes Relative 7  3 - 12 %   Monocytes Absolute 0.7  0.1 - 1.0 K/uL   Eosinophils Relative 0  0 - 5 %   Eosinophils Absolute 0.0  0.0 - 0.7 K/uL   Basophils Relative 0  0 - 1 %   Basophils Absolute 0.0  0.0 - 0.1 K/uL  COMPREHENSIVE METABOLIC PANEL      Result Value Ref Range   Sodium 142  137 - 147 mEq/L   Potassium 5.9 (*) 3.7 - 5.3 mEq/L   Chloride 108  96 - 112 mEq/L   CO2 22  19 - 32 mEq/L   Glucose, Bld 114 (*) 70 - 99 mg/dL   BUN 34 (*) 6 - 23 mg/dL   Creatinine, Ser 7.821.36 (*) 0.50 - 1.10 mg/dL   Calcium 9.0  8.4 - 95.610.5 mg/dL   Total Protein 6.9  6.0 - 8.3 g/dL   Albumin 3.4 (*) 3.5 - 5.2 g/dL   AST 18  0 - 37 U/L   ALT 10  0 - 35 U/L   Alkaline Phosphatase 76  39 - 117 U/L   Total Bilirubin 0.3  0.3 - 1.2 mg/dL   GFR calc non Af Amer 32 (*) >90 mL/min   GFR calc Af Amer 37 (*) >90 mL/min   Anion gap 12  5 -  15  CK      Result Value Ref Range   Total CK 209 (*) 7 - 177 U/L  POTASSIUM      Result Value Ref Range   Potassium 5.7 (*) 3.7 - 5.3 mEq/L  RETICULOCYTES      Result Value Ref Range   Retic Ct Pct 1.2  0.4 - 3.1 %    RBC. 3.20 (*) 3.87 - 5.11 MIL/uL   Retic Count, Manual 38.4  19.0 - 186.0 K/uL  CBG MONITORING, ED      Result Value Ref Range   Glucose-Capillary 117 (*) 70 - 99 mg/dL   Comment 1 Notify RN     Laboratory interpretation all normal except stable anemia, stable chronic renal insufficiency, hyperkalemia with no acute changes on her EKG. Patient has minimally elevated CK so no rhabdomyolysis present.   Imaging Review Dg Pelvis 1-2 Views  02/06/2014   CLINICAL DATA:  Status post fall last night. Patient found down. Initial encounter.  EXAM: PELVIS - 1-2 VIEW  COMPARISON:  Plain films of the hips 08/29/2013.  FINDINGS: No acute bony or joint abnormality is identified. Intra medullary nail fixing an old healed left femur fracture is unchanged in appearance.  IMPRESSION: No acute abnormality.   Electronically Signed   By: Drusilla Kannerhomas  Dalessio M.D.   On: 02/06/2014 09:32   Ct Head Wo Contrast  Ct Cervical Spine Wo Contrast  Ct Maxillofacial Wo Cm  02/06/2014   CLINICAL DATA:  Fall during the night found on floor of nursing facility on her right side; laceration to rt side of face and nose  EXAM: CT HEAD WITHOUT CONTRAST  CT MAXILLOFACIAL WITHOUT CONTRAST  CT CERVICAL SPINE WITHOUT CONTRAST  TECHNIQUE: Multidetector CT imaging of the head, cervical spine, and maxillofacial structures were performed using the standard protocol without intravenous contrast. Multiplanar CT image reconstructions of the cervical spine and maxillofacial structures were also generated.  COMPARISON:  Head CT 11/12/2013.  FINDINGS: CT HEAD FINDINGS  No intracranial hemorrhage. No parenchymal contusion. No midline shift or mass effect. Basilar cisterns are patent. No skull base fracture. No fluid in the paranasal sinuses or mastoid air cells. Orbits are normal.  Remote infarction in the left cerebellum. Generalized cortical atrophy. This extensive periventricular and subcortical white matter hypodensities.  There is scalp hematoma  midline over the right frontal bone. No associated skull fracture.  There is fluid in the right maxillary sinus.  CT MAXILLOFACIAL FINDINGS  Right frontal scalp hematoma. No evidence of orbital fracture. Zygomatic arches are intact. No maxillary bone fracture. Pterygoid plates are intact. Smaller fluid in the right maxillary sinus. Mandibular condyles are located. There is motion artifact which blurs the mandible. Repeat images performed through the mandible with persistent abnormality. There is step-off of the mandibular bodies bilaterally and more prominent on the left.  On the coronal images air are system and interbody difficult cell this is motion artifact or fracture.  CT CERVICAL SPINE FINDINGS  There is a fracture through the midbody of the dens with posterior displacement of the upper dens by 5 mm. There are nondisplaced vertical fractures through the left and right neural arch of C2 seen on image 16, series 16. No epidural hematoma. No significant prevertebral soft tissue swelling. The C3 through T1 vertebral bodies are normal without evidence of fracture or subluxation. No epidural paraspinal hematoma.  IMPRESSION: 1. Right frontal scalp hematoma. 2. No evidence of intracranial trauma. 3. Concern for mandibular rami fractures bilaterally and more prominent on  the left. There is significant motion artifact through this region. Recommend clinical correlation. 4. Acute fracture of the C2 body of the dens as well as the neural arch of C2 on the left and right. Findings conveyed toIVA Nel Stoneking on 02/06/2014  at09:48.   Electronically Signed   By: Genevive Bi M.D.   On: 02/06/2014 09:48     EKG Interpretation   Date/Time:  Monday February 06 2014 10:05:06 EDT Ventricular Rate:  78 PR Interval:  195 QRS Duration: 116 QT Interval:  450 QTC Calculation: 513 R Axis:   -55 Text Interpretation:  Sinus rhythm Atrial premature complex Incomplete  RBBB and LAFB Artifact No significant change since last  tracing 03 Feb 2014 Confirmed by [facility]  MD-I, Charitie Hinote ([id number]) on 02/06/2014 1:15:53 PM      MDM   Final diagnoses:  Hypothermia, initial encounter  Chronic renal insufficiency, unspecified stage  Laceration of face, initial encounter  Dens fracture, closed, initial encounter  Cervical spine fracture, initial encounter   Plan admission  Devoria Albe, MD, FACEP   CRITICAL CARE Performed by: Devoria Albe L Total critical care time: 38 min Critical care time was exclusive of separately billable procedures and treating other patients. Critical care was necessary to treat or prevent imminent or life-threatening deterioration. Critical care was time spent personally by me on the following activities: development of treatment plan with patient and/or surrogate as well as nursing, discussions with consultants, evaluation of patient's response to treatment, examination of patient, obtaining history from patient or surrogate, ordering and performing treatments and interventions, ordering and review of laboratory studies, ordering and review of radiographic studies, pulse oximetry and re-evaluation of patient's condition.   I personally performed the services described in this documentation, which was scribed in my presence. The recorded information has been reviewed and considered.  Devoria Albe, MD, FACEP    Ward Givens, MD 02/06/14 [phone]

## 2014-02-06 NOTE — Progress Notes (Signed)
ANTIBIOTIC CONSULT NOTE - INITIAL  Pharmacy Consult for Levaquin Indication: pneumonia  No Known Allergies  Patient Measurements: Height: 5\' 4"  (162.6 cm) Weight: 99 lb 3.3 oz (45 kg) IBW/kg (Calculated) : 54.7 Adjusted Body Weight:   Vital Signs: Temp: 98.5 F (36.9 C) (10/19 1551) Temp Source: Axillary (10/19 1551) BP: 143/97 mmHg (10/19 1551) Pulse Rate: 85 (10/19 1551) Intake/Output from previous day:   Intake/Output from this shift:    Labs:  Recent Labs  02/06/14 0835  WBC 9.7  HGB 10.5*  PLT 157  CREATININE 1.36*   Estimated Creatinine Clearance: 17.2 ml/min (by C-G formula based on Cr of 1.36). No results found for this basename: VANCOTROUGH, VANCOPEAK, VANCORANDOM, GENTTROUGH, GENTPEAK, GENTRANDOM, TOBRATROUGH, TOBRAPEAK, TOBRARND, AMIKACINPEAK, AMIKACINTROU, AMIKACIN,  in the last 72 hours   Microbiology: No results found for this or any previous visit (from the past 720 hour(s)).  Medical History: Past Medical History  Diagnosis Date  . Organic brain syndrome   . Hard of hearing   . Chronic alcohol abuse   . Hyperlipidemia   . Fall   . CKD (chronic kidney disease), stage III   . CVA (cerebral infarction)   . Dementia   . Compression fx, lumbar spine     L1 and L2 2015    Medications:  Scheduled:  . [START ON 02/08/2014] aspirin EC  81 mg Oral Daily  . docusate sodium  100 mg Oral QHS  . feeding supplement (ENSURE)  1 Container Oral TID BM  . heparin  5,000 Units Subcutaneous 3 times per day  . [START ON 02/07/2014] Influenza vac split quadrivalent PF  0.5 mL Intramuscular Tomorrow-1000  . [START ON 02/08/2014] levofloxacin (LEVAQUIN) IV  500 mg Intravenous Q48H  . levofloxacin (LEVAQUIN) IV  750 mg Intravenous Q24H  . [START ON 02/07/2014] pneumococcal 23 valent vaccine  0.5 mL Intramuscular Tomorrow-1000  . simvastatin  20 mg Oral QPM  . sodium chloride  3 mL Intravenous Q12H  . thioridazine  10 mg Oral BID   Assessment: Questionable  infiltrates on chest x-ray. Levaquin empirically to be started Reduced renal function  Goal of Therapy:  Eradicate infection  Plan:  Levaquin 750 mg IV x 1 dose, then 500 mg IV every 48 hours Monitor renal function Labs per protocol F/U LOT  Raquel JamesPittman, Field Staniszewski Bennett 02/06/2014,6:28 PM

## 2014-02-06 NOTE — Progress Notes (Signed)
Pt has has a nose bleed with a moderate amount of blood noted. Packing is in pt's nose. She is demented and continues to try to pull packing out. Mitts are placed. Will continue to monitor.

## 2014-02-07 DIAGNOSIS — Y92122 Bedroom in nursing home as the place of occurrence of the external cause: Secondary | ICD-10-CM | POA: Diagnosis not present

## 2014-02-07 DIAGNOSIS — N179 Acute kidney failure, unspecified: Secondary | ICD-10-CM | POA: Diagnosis not present

## 2014-02-07 DIAGNOSIS — E875 Hyperkalemia: Secondary | ICD-10-CM | POA: Diagnosis not present

## 2014-02-07 DIAGNOSIS — E785 Hyperlipidemia, unspecified: Secondary | ICD-10-CM | POA: Diagnosis not present

## 2014-02-07 DIAGNOSIS — S12120A Other displaced dens fracture, initial encounter for closed fracture: Secondary | ICD-10-CM | POA: Diagnosis present

## 2014-02-07 DIAGNOSIS — J189 Pneumonia, unspecified organism: Secondary | ICD-10-CM | POA: Diagnosis not present

## 2014-02-07 DIAGNOSIS — G309 Alzheimer's disease, unspecified: Secondary | ICD-10-CM | POA: Diagnosis not present

## 2014-02-07 DIAGNOSIS — W19XXXA Unspecified fall, initial encounter: Secondary | ICD-10-CM | POA: Diagnosis not present

## 2014-02-07 DIAGNOSIS — H9190 Unspecified hearing loss, unspecified ear: Secondary | ICD-10-CM | POA: Diagnosis not present

## 2014-02-07 DIAGNOSIS — Z23 Encounter for immunization: Secondary | ICD-10-CM | POA: Diagnosis not present

## 2014-02-07 DIAGNOSIS — I2510 Atherosclerotic heart disease of native coronary artery without angina pectoris: Secondary | ICD-10-CM | POA: Diagnosis not present

## 2014-02-07 DIAGNOSIS — N183 Chronic kidney disease, stage 3 (moderate): Secondary | ICD-10-CM | POA: Diagnosis not present

## 2014-02-07 DIAGNOSIS — S0003XA Contusion of scalp, initial encounter: Secondary | ICD-10-CM | POA: Diagnosis not present

## 2014-02-07 DIAGNOSIS — F09 Unspecified mental disorder due to known physiological condition: Secondary | ICD-10-CM | POA: Diagnosis not present

## 2014-02-07 DIAGNOSIS — D638 Anemia in other chronic diseases classified elsewhere: Secondary | ICD-10-CM | POA: Diagnosis not present

## 2014-02-07 DIAGNOSIS — T68XXXA Hypothermia, initial encounter: Secondary | ICD-10-CM | POA: Diagnosis present

## 2014-02-07 DIAGNOSIS — S01111A Laceration without foreign body of right eyelid and periocular area, initial encounter: Secondary | ICD-10-CM | POA: Diagnosis not present

## 2014-02-07 DIAGNOSIS — Z681 Body mass index (BMI) 19 or less, adult: Secondary | ICD-10-CM | POA: Diagnosis not present

## 2014-02-07 DIAGNOSIS — Z8673 Personal history of transient ischemic attack (TIA), and cerebral infarction without residual deficits: Secondary | ICD-10-CM | POA: Diagnosis not present

## 2014-02-07 DIAGNOSIS — E43 Unspecified severe protein-calorie malnutrition: Secondary | ICD-10-CM | POA: Diagnosis not present

## 2014-02-07 DIAGNOSIS — I472 Ventricular tachycardia: Secondary | ICD-10-CM | POA: Diagnosis not present

## 2014-02-07 DIAGNOSIS — F0280 Dementia in other diseases classified elsewhere without behavioral disturbance: Secondary | ICD-10-CM | POA: Diagnosis present

## 2014-02-07 LAB — FERRITIN: FERRITIN: 175 ng/mL (ref 10–291)

## 2014-02-07 LAB — BASIC METABOLIC PANEL WITH GFR
Anion gap: 13 (ref 5–15)
BUN: 25 mg/dL — ABNORMAL HIGH (ref 6–23)
CO2: 21 meq/L (ref 19–32)
Calcium: 8.1 mg/dL — ABNORMAL LOW (ref 8.4–10.5)
Chloride: 108 meq/L (ref 96–112)
Creatinine, Ser: 1.2 mg/dL — ABNORMAL HIGH (ref 0.50–1.10)
GFR calc Af Amer: 43 mL/min — ABNORMAL LOW
GFR calc non Af Amer: 37 mL/min — ABNORMAL LOW
Glucose, Bld: 100 mg/dL — ABNORMAL HIGH (ref 70–99)
Potassium: 4.3 meq/L (ref 3.7–5.3)
Sodium: 142 meq/L (ref 137–147)

## 2014-02-07 LAB — MRSA PCR SCREENING: MRSA by PCR: NEGATIVE

## 2014-02-07 LAB — CBC
HCT: 26.9 % — ABNORMAL LOW (ref 36.0–46.0)
Hemoglobin: 9 g/dL — ABNORMAL LOW (ref 12.0–15.0)
MCH: 29.6 pg (ref 26.0–34.0)
MCHC: 33.5 g/dL (ref 30.0–36.0)
MCV: 88.5 fL (ref 78.0–100.0)
Platelets: 137 10*3/uL — ABNORMAL LOW (ref 150–400)
RBC: 3.04 MIL/uL — ABNORMAL LOW (ref 3.87–5.11)
RDW: 14 % (ref 11.5–15.5)
WBC: 6 10*3/uL (ref 4.0–10.5)

## 2014-02-07 LAB — CK: Total CK: 252 U/L — ABNORMAL HIGH (ref 7–177)

## 2014-02-07 LAB — TSH: TSH: 2.14 u[IU]/mL (ref 0.350–4.500)

## 2014-02-07 LAB — IRON AND TIBC
Iron: 35 ug/dL — ABNORMAL LOW (ref 42–135)
Saturation Ratios: 17 % — ABNORMAL LOW (ref 20–55)
TIBC: 204 ug/dL — ABNORMAL LOW (ref 250–470)
UIBC: 169 ug/dL (ref 125–400)

## 2014-02-07 LAB — VITAMIN B12: Vitamin B-12: 475 pg/mL (ref 211–911)

## 2014-02-07 LAB — FOLATE: Folate: >20 ng/mL

## 2014-02-07 MED ORDER — ENSURE COMPLETE PO LIQD
237.0000 mL | Freq: Two times a day (BID) | ORAL | Status: DC
Start: 2014-02-07 — End: 2014-02-14
  Administered 2014-02-07 – 2014-02-14 (×12): 237 mL via ORAL

## 2014-02-07 NOTE — Plan of Care (Addendum)
Problem: Consults Goal: General Medical Patient Education See Patient Education Module for specific education.  Outcome: Not Met (add Reason) Patient is confused

## 2014-02-07 NOTE — Progress Notes (Signed)
Subjective: The patient had a fairly comfortable night. She is responsive. She was admitted through ED following fall in nursing facility . She is noted to have laceration of scalp and hematoma of right side of head. Also has C2 cervical fracture has Aspen collar in place. She does have other medical problems. These are anemia related to chronic disease, CK D. stage III, hyperlipidemia, organic brain syndrome, compression fracture lumbar spine, history of CVA with multiple lacunar infarcts and remote left cerebral infarct. Objective: Vital signs in last 24 hours: Temp:  [90 F (32.2 C)-98.5 F (36.9 C)] 97.7 F (36.5 C) (10/19 2103) Pulse Rate:  [65-118] 75 (10/19 2103) Resp:  [12-19] 18 (10/19 2103) BP: (98-154)/(65-120) 131/80 mmHg (10/19 2103) SpO2:  [98 %-100 %] 100 % (10/19 2103) Weight:  [43.092 kg (95 lb)-45 kg (99 lb 3.3 oz)] 45 kg (99 lb 3.3 oz) (10/19 1550) Weight change:  Last BM Date:  (Unable to access, pt unable to communicate)  Intake/Output from previous day:   Intake/Output this shift:    Physical Exam: General appearance patient is responsive and fairly comfortable  Eyes PERRLA-bruise right upper and lower lids  ENT negative  Neck Aspen collar in place  Heart regular rhythm no murmurs  Lungs clear to P&A  Abdomen no palpable organs or masses  Skin patient is laceration right side of head no other skin abnormalities  Neurological cranial nerves intact no motor or sensory abnormalities   Recent Labs  02/06/14 0835  WBC 9.7  HGB 10.5*  HCT 30.9*  PLT 157   BMET  Recent Labs  02/06/14 0835 02/06/14 1437  NA 142  --   K 5.9* 5.7*  CL 108  --   CO2 22  --   GLUCOSE 114*  --   BUN 34*  --   CREATININE 1.36*  --   CALCIUM 9.0  --     Studies/Results: Dg Pelvis 1-2 Views  02/06/2014   CLINICAL DATA:  Status post fall last night. Patient found down. Initial encounter.  EXAM: PELVIS - 1-2 VIEW  COMPARISON:  Plain films of the hips  08/29/2013.  FINDINGS: No acute bony or joint abnormality is identified. Intra medullary nail fixing an old healed left femur fracture is unchanged in appearance.  IMPRESSION: No acute abnormality.   Electronically Signed   By: Drusilla Kanner M.D.   On: 02/06/2014 09:32   Ct Head Wo Contrast  02/06/2014   CLINICAL DATA:  Fall during the night found on floor of nursing facility on her right side; laceration to rt side of face and nose  EXAM: CT HEAD WITHOUT CONTRAST  CT MAXILLOFACIAL WITHOUT CONTRAST  CT CERVICAL SPINE WITHOUT CONTRAST  TECHNIQUE: Multidetector CT imaging of the head, cervical spine, and maxillofacial structures were performed using the standard protocol without intravenous contrast. Multiplanar CT image reconstructions of the cervical spine and maxillofacial structures were also generated.  COMPARISON:  Head CT 11/12/2013.  FINDINGS: CT HEAD FINDINGS  No intracranial hemorrhage. No parenchymal contusion. No midline shift or mass effect. Basilar cisterns are patent. No skull base fracture. No fluid in the paranasal sinuses or mastoid air cells. Orbits are normal.  Remote infarction in the left cerebellum. Generalized cortical atrophy. This extensive periventricular and subcortical white matter hypodensities.  There is scalp hematoma midline over the right frontal bone. No associated skull fracture.  There is fluid in the right maxillary sinus.  CT MAXILLOFACIAL FINDINGS  Right frontal scalp hematoma. No evidence of orbital fracture. Zygomatic  arches are intact. No maxillary bone fracture. Pterygoid plates are intact. Smaller fluid in the right maxillary sinus. Mandibular condyles are located. There is motion artifact which blurs the mandible. Repeat images performed through the mandible with persistent abnormality. There is step-off of the mandibular bodies bilaterally and more prominent on the left.  On the coronal images air are system and interbody difficult cell this is motion artifact or  fracture.  CT CERVICAL SPINE FINDINGS  There is a fracture through the midbody of the dens with posterior displacement of the upper dens by 5 mm. There are nondisplaced vertical fractures through the left and right neural arch of C2 seen on image 16, series 16. No epidural hematoma. No significant prevertebral soft tissue swelling. The C3 through T1 vertebral bodies are normal without evidence of fracture or subluxation. No epidural paraspinal hematoma.  IMPRESSION: 1. Right frontal scalp hematoma. 2. No evidence of intracranial trauma. 3. Concern for mandibular rami fractures bilaterally and more prominent on the left. There is significant motion artifact through this region. Recommend clinical correlation. 4. Acute fracture of the C2 body of the dens as well as the neural arch of C2 on the left and right. Findings conveyed toIVA KNAPP on 02/06/2014  at09:48.   Electronically Signed   By: Genevive BiStewart  Edmunds M.D.   On: 02/06/2014 09:48   Ct Cervical Spine Wo Contrast  02/06/2014   CLINICAL DATA:  Fall during the night found on floor of nursing facility on her right side; laceration to rt side of face and nose  EXAM: CT HEAD WITHOUT CONTRAST  CT MAXILLOFACIAL WITHOUT CONTRAST  CT CERVICAL SPINE WITHOUT CONTRAST  TECHNIQUE: Multidetector CT imaging of the head, cervical spine, and maxillofacial structures were performed using the standard protocol without intravenous contrast. Multiplanar CT image reconstructions of the cervical spine and maxillofacial structures were also generated.  COMPARISON:  Head CT 11/12/2013.  FINDINGS: CT HEAD FINDINGS  No intracranial hemorrhage. No parenchymal contusion. No midline shift or mass effect. Basilar cisterns are patent. No skull base fracture. No fluid in the paranasal sinuses or mastoid air cells. Orbits are normal.  Remote infarction in the left cerebellum. Generalized cortical atrophy. This extensive periventricular and subcortical white matter hypodensities.  There is  scalp hematoma midline over the right frontal bone. No associated skull fracture.  There is fluid in the right maxillary sinus.  CT MAXILLOFACIAL FINDINGS  Right frontal scalp hematoma. No evidence of orbital fracture. Zygomatic arches are intact. No maxillary bone fracture. Pterygoid plates are intact. Smaller fluid in the right maxillary sinus. Mandibular condyles are located. There is motion artifact which blurs the mandible. Repeat images performed through the mandible with persistent abnormality. There is step-off of the mandibular bodies bilaterally and more prominent on the left.  On the coronal images air are system and interbody difficult cell this is motion artifact or fracture.  CT CERVICAL SPINE FINDINGS  There is a fracture through the midbody of the dens with posterior displacement of the upper dens by 5 mm. There are nondisplaced vertical fractures through the left and right neural arch of C2 seen on image 16, series 16. No epidural hematoma. No significant prevertebral soft tissue swelling. The C3 through T1 vertebral bodies are normal without evidence of fracture or subluxation. No epidural paraspinal hematoma.  IMPRESSION: 1. Right frontal scalp hematoma. 2. No evidence of intracranial trauma. 3. Concern for mandibular rami fractures bilaterally and more prominent on the left. There is significant motion artifact through this region. Recommend  clinical correlation. 4. Acute fracture of the C2 body of the dens as well as the neural arch of C2 on the left and right. Findings conveyed toIVA KNAPP on 02/06/2014  at09:48.   Electronically Signed   By: Genevive Bi M.D.   On: 02/06/2014 09:48   Dg Chest Portable 1 View  02/06/2014   CLINICAL DATA:  Unwitnessed fall last night with patient found on floor this morning with head laceration; history of previous CVA, organic brain syndrome, and chronic alcohol abuse.  EXAM: PORTABLE CHEST - 1 VIEW  COMPARISON:  Limited views of the chest from any  thoracic spine series of Aug 29, 2013  FINDINGS: The lungs are adequately inflated. There are coarse infrahilar lung markings on the left. The cardiac silhouette is top-normal in size. The pulmonary vascularity is not engorged. There is mild tortuosity of the descending thoracic aorta. There is mild soft tissue fullness in the upper right paratracheal region which may be accentuated by patient rotation. There is no pleural effusion. There is superior positioning of the right humeral head with respect to the glenoid which may reflect previous rotator cuff arthropathy.  IMPRESSION: 1. There are coarse left infrahilar lung markings which may reflect atelectasis or pneumonia. There is no evidence of CHF. 2. There are degenerative changes of the right shoulder with superior migration of the humeral head. This is only partially included in the field of view. 3. There is soft tissue fullness in the right paratracheal region. This is of uncertain etiology and was not visible on the previous study which was markedly different in positioning. When the patient can tolerate the procedure, a well-positioned PA chest x-ray would be useful. Alternatively, CT scanning of the chest could be considered.   Electronically Signed   By: David  [place]   On: 02/06/2014 15:39   Dg Shoulder Left Port  02/06/2014   CLINICAL DATA:  Status post fall this morning. Left shoulder pain. Initial encounter.  EXAM: LEFT SHOULDER - 1 VIEW  COMPARISON:  None.  FINDINGS: The humeral head is high-riding consistent with chronic rotator cuff tear. The humeral head appears anteriorly subluxed on the Y-view. There is sclerosis of the articular surface the humeral head and adjacent coracoid process suggesting chronic change. No fracture is identified. Mild acromioclavicular degenerative change is noted.  IMPRESSION: Abnormal appearance of left shoulder most suggestive of chronic anterior subluxation of the humeral head and chronic rotator cuff tear.   Negative for fracture.   Electronically Signed   By: Drusilla Kanner M.D.   On: 02/06/2014 12:48   Ct Maxillofacial Wo Cm  02/06/2014   CLINICAL DATA:  Fall during the night found on floor of nursing facility on her right side; laceration to rt side of face and nose  EXAM: CT HEAD WITHOUT CONTRAST  CT MAXILLOFACIAL WITHOUT CONTRAST  CT CERVICAL SPINE WITHOUT CONTRAST  TECHNIQUE: Multidetector CT imaging of the head, cervical spine, and maxillofacial structures were performed using the standard protocol without intravenous contrast. Multiplanar CT image reconstructions of the cervical spine and maxillofacial structures were also generated.  COMPARISON:  Head CT 11/12/2013.  FINDINGS: CT HEAD FINDINGS  No intracranial hemorrhage. No parenchymal contusion. No midline shift or mass effect. Basilar cisterns are patent. No skull base fracture. No fluid in the paranasal sinuses or mastoid air cells. Orbits are normal.  Remote infarction in the left cerebellum. Generalized cortical atrophy. This extensive periventricular and subcortical white matter hypodensities.  There is scalp hematoma midline over the right frontal  bone. No associated skull fracture.  There is fluid in the right maxillary sinus.  CT MAXILLOFACIAL FINDINGS  Right frontal scalp hematoma. No evidence of orbital fracture. Zygomatic arches are intact. No maxillary bone fracture. Pterygoid plates are intact. Smaller fluid in the right maxillary sinus. Mandibular condyles are located. There is motion artifact which blurs the mandible. Repeat images performed through the mandible with persistent abnormality. There is step-off of the mandibular bodies bilaterally and more prominent on the left.  On the coronal images air are system and interbody difficult cell this is motion artifact or fracture.  CT CERVICAL SPINE FINDINGS  There is a fracture through the midbody of the dens with posterior displacement of the upper dens by 5 mm. There are nondisplaced  vertical fractures through the left and right neural arch of C2 seen on image 16, series 16. No epidural hematoma. No significant prevertebral soft tissue swelling. The C3 through T1 vertebral bodies are normal without evidence of fracture or subluxation. No epidural paraspinal hematoma.  IMPRESSION: 1. Right frontal scalp hematoma. 2. No evidence of intracranial trauma. 3. Concern for mandibular rami fractures bilaterally and more prominent on the left. There is significant motion artifact through this region. Recommend clinical correlation. 4. Acute fracture of the C2 body of the dens as well as the neural arch of C2 on the left and right. Findings conveyed toIVA KNAPP on 02/06/2014  at09:48.   Electronically Signed   By: Genevive BiStewart  Edmunds M.D.   On: 02/06/2014 09:48    Medications:  . [START ON 02/08/2014] aspirin EC  81 mg Oral Daily  . docusate sodium  100 mg Oral QHS  . feeding supplement (ENSURE)  1 Container Oral TID BM  . heparin  5,000 Units Subcutaneous 3 times per day  . Influenza vac split quadrivalent PF  0.5 mL Intramuscular Tomorrow-1000  . [START ON 02/08/2014] levofloxacin (LEVAQUIN) IV  500 mg Intravenous Q48H  . levofloxacin (LEVAQUIN) IV  750 mg Intravenous Q24H  . pneumococcal 23 valent vaccine  0.5 mL Intramuscular Tomorrow-1000  . simvastatin  20 mg Oral QPM  . sodium chloride  3 mL Intravenous Q12H  . thioridazine  10 mg Oral BID    . dextrose 5 % and 0.45% NaCl 1,000 mL with sodium bicarbonate 50 mEq infusion 75 mL/hr at 02/06/14 1903     Assessment/Plan: 1. Head injury following fall with laceration right side of scalp and hematoma-continue to monitor vital signs  2. Possible pneumonia lower lobes-plan to continue Levaquin 750 mg IV every 24 hours  3. C2 cervical fracture-continue Aspen collar-patient will followup with Dr. Lovell SheehanJenkins of brain tumor in 1 month  4. CK D. stage III-patient at baseline continue IV fluids  5. History CVA with associated organic brain  syndrome-patient at baseline continue supportive care-previous multiple lacunar infarcts and cerebral infarction  6. Compression fracture lumbar spine thought to be chronic  6.   LOS: 1 day   Mccartney Chuba G 02/07/2014, 6:24 AM

## 2014-02-07 NOTE — Progress Notes (Addendum)
Pt's nose bleed has stopped at this time. 

## 2014-02-07 NOTE — Clinical Social Work Psychosocial (Signed)
Clinical Social Work Department BRIEF PSYCHOSOCIAL ASSESSMENT 02/07/2014  Patient:  Kendra [name], Kendra [name]     Account Number:  [id number]     Admit date:  02/06/2014  Clinical Social Worker:  Legrand Como  Date/Time:  02/07/2014 11:05 AM  Referred by:  CSW  Date Referred:  02/07/2014 Referred for  ALF Placement   Other Referral:   Interview type:  Patient Other interview type:   [facility]    PSYCHOSOCIAL DATA Living Status:  FACILITY Admitted from facility:   Level of care:  Assisted Living Primary support name:  [place] Primary support relationship to patient:  NONE Degree of support available:   Patient recieves support from staff at [facility].    CURRENT CONCERNS Current Concerns  Post-Acute Placement   Other Concerns:    SOCIAL WORK ASSESSMENT / PLAN CSW met with patient who was oriented to self only. Patient is a resident at [facility].  CSW spoke with [facility], of [name].  Mrs. Loanne Drilling indicated that patient's next of kin are her neice and nephew who live in [place]. She stated that they visit patient about once yearly or once every other year.  Mrs. Loanne Drilling indicated that patient does not have a legal guardian and that the facility typically makes decisions on behalf of patient. Mrs. Loanne Drilling reported that patient is able to walk short distances unassisted.  She stated that patient has a walker but she does not use it. Mrs. Loanne Drilling stated that the facility cuts patient's food into small pieces and that patient feeds herself.  She indicated that patient wears adult briefs for incontinence issues. Mrs. Loanne Drilling indicated that patient does not like to soil her brief so she tries to make it to the bathroom when she has to go.  She stated that staff typically assist her to the bathroom due to the distance.  Mrs. Loanne Drilling indicated that staff assists patient with bathing and grooming.  She  indicated that patient receives 24/7 care at the facility and that patient's room is right across from staff's room. Mrs. Loanne Drilling indicated that patient has been a resident at the facility since the 1980's and that they consider her family.  She indicated that patient could come back to the facility.   CSW spoke to PT. PT indicated that patient could go back to the facility as long as they provided 24/7 care to patient.  PT indicated that they would put in an order for home health to assist with patient upon her discharge back to the facility.  CSW communicated the plan from PT to Mrs. Loanne Drilling who was agreeable.  Mrs. Loanne Drilling indicated that she would be arriving to the hospital soon to visit patient.  CSW requested that Mrs. Loanne Drilling provide her with the names of patient's neice and nephew and contact numbers to speak to them regarding patient. Mrs. Loanne Drilling indicated that she would contact CSW back to provide the information upon her return to the facility to get the information out of patient's file.   Assessment/plan status:  Information/Referral to [employer] Other assessment/ plan:   Information/referral to community resources:    PATIENT'S/FAMILY'S RESPONSE TO PLAN OF CARE: [name] is willing for patient to be returned to their facility upon discharge.  They are agreeable to home health services coming into the facility and working with patient.    Ambrose Pancoast, [place]

## 2014-02-07 NOTE — Care Management Note (Addendum)
    Page 1 of 2   02/13/2014     2:05:01 PM CARE MANAGEMENT NOTE 02/13/2014  Patient:  Kendra [name],Kendra [name]   Account Number:  [id number]  Date Initiated:  02/07/2014  Documentation initiated by:  Kendra [name],Kendra [name]  Subjective/Objective Assessment:   Pt admitted Kendra [name] with hypothermia and possible pneumonia. Pt will return to facility at discharge.     Action/Plan:   CSW is aware and will arrange discharge to facility when medically stable. PT is recommending HH PT at discharge.   Anticipated DC Date:  02/11/2014   Anticipated DC Plan:  ASSISTED LIVING / REST HOME  In-house referral  Clinical Social Worker      DC Associate Professorlanning Services  CM consult      PAC Choice  DURABLE MEDICAL EQUIPMENT  HOME HEALTH   Choice offered to / List presented to:  [name]-4 Adult Children   DME arranged  WALKER - Kendra [name]      DME agency  [facility]     HH arranged  [facility]2 PT      [facility]   Status of service:  Completed, signed off Medicare Important Message given?  YES (If response is "NO", the following Medicare IM given date fields will be blank) Date Medicare IM given:  02/10/2014 Medicare IM given by:  Kendra HendersonBOLDEN,[name] Date Additional Medicare IM given:  02/13/2014 Additional Medicare IM given by:  Kendra RothmanAMMY [name] Garnett Rekowski  Discharge Disposition:  ASSISTED LIVING  Per UR Regulation:  Reviewed for med. necessity/level of care/duration of stay  If discussed at Long Length of Stay Meetings, dates discussed:    Comments:  02/13/14 1400 Kendra Queenammy Taegan Standage, RN BSN CM [name]. [facility] PT arranged with [facility] (per facility choice). [name] will collect the pts information from the chart. HH services to start within 48 hours of d/[name]. Rolling walk[facility] to pts room [name]. CSW to arrange discharge to facility.  02/10/14 1615 Kendra HendersonGeneva Bolden RN/CM PT recommends HH/PT. [name] case [facility] goes back  to  ALF/rest home facility, [name]. Granddaughter may decide on SNF per CSW. 02/07/14 1130 Kendra Queenammy Cynthea Zachman, RN BSN CM

## 2014-02-07 NOTE — Progress Notes (Signed)
INITIAL NUTRITION ASSESSMENT  DOCUMENTATION CODES Per approved criteria  -Severe Malnutrition in the context of chronic illness -Underweight   INTERVENTION: Ensure Complete po BID, each supplement provides 350 kcal and 13 grams of protein   NUTRITION DIAGNOSIS: Inadequate oral intake  related to acute illness/ injuryas evidenced by meal intake 0-25%.      Goal: Pt to meet >/= 90% of their estimated nutrition needs    Monitor:  Skin assessments, Meals and supplements -percent po intake, labs and wt trends   Reason for Assessment: Low Braden Score=12  80 y.o. female  Admitting Dx: Hypothermia  ASSESSMENT: [facility] Her hx includes severe malnutrition, anemia related to chronic disease, CKD, stage III, hyperlipidemia, organic brain syndrome, compression fracture lumbar spine, history of CVA with multiple lacunar infarcts and remote left cerebral infarct. Ms Machamer's appetite is poor 0-25% of meals consumed today. She has mitts on and is being fed by staff. Her wt has increased 4% in 3 days.    Labs reviewed: BUN, Creat.and glucose trending down.  Height: Ht Readings from Last 1 Encounters:  02/06/14 5\' 4"  (1.626 m)    Weight: Wt Readings from Last 1 Encounters:  02/06/14 99 lb 3.3 oz (45 kg)    Ideal Body Weight: 120#  % Ideal Body Weight: 83%  Wt Readings from Last 10 Encounters:  02/06/14 99 lb 3.3 oz (45 kg)  02/03/14 95 lb (43.092 kg)  08/29/13 95 lb 10.9 oz (43.4 kg)    Usual Body Weight: 95#  % Usual Body Weight: 104%  BMI:  Body mass index is 17.02 kg/(m^2). underweight  Estimated Nutritional Needs: Kcal: 1350-1485 Protein: 58-67 gr Fluid: 1.4-1.5 liters daily  Skin: laceration to right side of face   Diet Order: Dysphagia 2 with thin liquids  EDUCATION NEEDS: -No education needs identified at this time   Intake/Output Summary (Last 24 hours) at 02/07/14 1153 Last data filed at 02/07/14 0800  Gross per 24 hour   Intake      0 ml  Output      0 ml  Net      0 ml    Last BM: 10/19   Labs:   Recent Labs Lab 02/03/14 1512 02/06/14 0835 02/06/14 1437 02/07/14 0552  NA 140 142  --  142  K 4.6 5.9* 5.7* 4.3  CL 107 108  --  108  CO2 20 22  --  21  BUN 40* 34*  --  25*  CREATININE 1.75* 1.36*  --  1.20*  CALCIUM 8.3* 9.0  --  8.1*  GLUCOSE 103* 114*  --  100*    CBG (last 3)   Recent Labs  02/06/14 0817  GLUCAP 117*    Scheduled Meds: . [START ON 02/08/2014] aspirin EC  81 mg Oral Daily  . docusate sodium  100 mg Oral QHS  . feeding supplement (ENSURE)  1 Container Oral TID BM  . heparin  5,000 Units Subcutaneous 3 times per day  . [START ON 02/08/2014] levofloxacin (LEVAQUIN) IV  500 mg Intravenous Q48H  . simvastatin  20 mg Oral QPM  . sodium chloride  3 mL Intravenous Q12H  . thioridazine  10 mg Oral BID    Continuous Infusions: . dextrose 5 % and 0.45% NaCl 1,000 mL with sodium bicarbonate 50 mEq infusion 75 mL/hr at 02/07/14 1047    Past Medical History  Diagnosis Date  . Organic brain syndrome   . Hard of hearing   .  Chronic alcohol abuse   . Hyperlipidemia   . Fall   . CKD (chronic kidney disease), stage III   . CVA (cerebral infarction)   . Dementia   . Compression fx, lumbar spine     L1 and L2 2015    Past Surgical History  Procedure Laterality Date  . Chronic alcohol abuse    . Fracture surgery    . Femur fracture surgery      Royann ShiversLynn Jesiel Garate MS,RD,CSG,LDN Office: #[phone] Pager: [phone]

## 2014-02-07 NOTE — Evaluation (Addendum)
Physical Therapy Evaluation Patient Details Name: Frutoso ChaseMattie E Stansbery MRN: [id number] DOB: [birth year] Today's Date: 02/07/2014   History of Present Illness  Zanna E Clinton SawyerWilliamson is a very pleasant 80 y.o. female who presents to ED with chief complaint fall. EMS reported patient fell at nursing facility at unknown time. Initial evaluation reveal C2 fracture, hyperkalemia, acute on chronic kidney failure and hypothermia.  There are degenerative changes of the right shoulder with superior migration of the humeral head. This is only partially included in the field of view. There is soft tissue fullness in the right paratracheal region.  Also has C2 cervical fracture has Aspen collar in place. She does have other medical problems. These are anemia related to chronic disease, CK D. stage III, hyperlipidemia, organic brain syndrome, compression fracture lumbar spine, history of CVA with multiple lacunar infarcts and remote left cerebral infarct.   Clinical Impression  Pt is a 80 year old female who presents to PT with dx of hypothermia.  Pt has a hx of organic brain syndrome, and unable to give hx during PT evaluation.  Pt AxO x1 per RN staff.  Per RN/NT, pt has been uncooperative with mobility skills and would not eat breakfast this morning.  During PT evaluation, pt had limited eye contact (pt often looking away from PT, unknown if this is related to [facility] or organic brain syndrome).  Pt was able to follow command to uncross legs when in bed, though when attempts at mobility skills per performed, pt did not participate and actively fought against PT when attempting to transfer to EOB.  Per RN, pt was attempting to get out of bed last night, though would not attempt with PT today.  Spoke with staff at ALF with case management/social work to determine PLOF and d/c recommendations.  Per staff, pt was able to amb short distances without AD, though required min assist with all other tasks for safety.  Staff does report  pt is more cooperative in the afternoons (PT evaluation performed in the morning).  Recommend continued PT while in the hospital to fully assess mobility skills, with transition to HHPT at discharge.  Staff at ALF reports they will be able to provide 24/7 care, and assistance with all ALDs and functional mobility skills as needed.  No DME recommendations at this time.     Follow Up Recommendations Home health PT;Supervision/Assistance - 24 hour (Spoke with case management on recommendations, who spoke with staff at ALF.  ALF reports they will be able to provide 24/7 assist for mobility skills and would allow pt to return with HHPT services.)    Equipment Recommendations  None recommended by PT       Precautions / Restrictions Precautions Precautions: Fall Required Braces or Orthoses: Cervical Brace Cervical Brace: Hard collar;At all times Restrictions Weight Bearing Restrictions: No      Mobility  Bed Mobility Overal bed mobility: Needs Assistance             General bed mobility comments: Unable to fully assess as pt refused to participate; staff at ALF reports pt does particpate in the afternoon.     Transfers                 General transfer comment: Unable to attempt as pt refused OOB activities/fought against PT when attempts to transfer to sitting EOB were performed.   Ambulation/Gait             General Gait Details: Unable to attempt.  Pertinent Vitals/Pain Pain Assessment: Faces Faces Pain Scale: No hurt    Home Living Family/patient expects to be discharged to:: Assisted living   Available Help at Discharge: Available 24 hours/day           Home Equipment: None Additional Comments: Pt unable to give hx secondary to hx of dementia, hx recieved from staff at ALF.     Prior Function Level of Independence: Independent;Needs assistance   Gait / Transfers Assistance Needed: Pt unable to give hx secondary to hx of dementia.  Per  staff at ALF, pt was able to walk short distance without AD.    ADL's / Homemaking Assistance Needed: Per staff at ALF, pt is mod (I) with feeding with set up and requires some assist with toileting and bathing (though pt attempts to complete tasks (I) staff assists to ensure cleanliness/safety).  Comments: Pt unable to give hx secondary to hx of dementia.  Pt lived in a ALF vs. group home per records and likely required assist with tasks.          Extremity/Trunk Assessment               Lower Extremity Assessment: Difficult to assess due to impaired cognition (Unable to fully assess as pt unable to follow commands for MMT, and reufsed/fought against any mobllity skills during evaluation)         Communication   Communication: HOH  Cognition Arousal/Alertness: Awake/alert Behavior During Therapy: Agitated (Pt in mitts prior to PT visit) Overall Cognitive Status: History of cognitive impairments - at baseline                        Assessment/Plan    PT Assessment Patient needs continued PT services  PT Diagnosis Difficulty walking;Generalized weakness   PT Problem List Decreased strength;Decreased activity tolerance;Decreased mobility  PT Treatment Interventions Functional mobility training;Therapeutic activities;Therapeutic exercise   PT Goals (Current goals can be found in the Care Plan section) Acute Rehab PT Goals Patient Stated Goal: none stated    Frequency Min 2X/week    End of Session   Activity Tolerance: Treatment limited secondary to agitation;Other (comment) (Limited by active participation of pt) Patient left: in bed;with call bell/phone within reach;with bed alarm set Nurse Communication: Mobility status    Functional Assessment Tool Used: Clinical Judgement Functional Limitation: Mobility: Walking and moving around Mobility: Walking and Moving Around Current Status ([id number]([id number]): 100 percent impaired, limited or restricted Mobility: Walking and  Moving Around Goal Status ([id number]([id number]): At least 20 percent but less than 40 percent impaired, limited or restricted    Time: 0954 (1050-1100 on call with social work/ALF regarding PLOF and d/c recommendations)-1005 PT Time Calculation (min): 11 min   Charges:   PT Evaluation $Initial PT Evaluation Tier I: 1 Procedure     PT G Codes:   Functional Assessment Tool Used: Clinical Judgement Functional Limitation: Mobility: Walking and moving around    [facility]OD[facility],Zoey Bidwell 02/07/2014, 11:03 AM

## 2014-02-08 LAB — URINE MICROSCOPIC-ADD ON

## 2014-02-08 LAB — URINALYSIS, ROUTINE W REFLEX MICROSCOPIC
Bilirubin Urine: NEGATIVE
Glucose, UA: NEGATIVE mg/dL
Ketones, ur: NEGATIVE mg/dL
Nitrite: NEGATIVE
PH: 7.5 (ref 5.0–8.0)
Protein, ur: 30 mg/dL — AB
SPECIFIC GRAVITY, URINE: 1.015 (ref 1.005–1.030)
Urobilinogen, UA: 1 mg/dL (ref 0.0–1.0)

## 2014-02-08 MED ORDER — DILTIAZEM HCL ER COATED BEADS 120 MG PO CP24
120.0000 mg | ORAL_CAPSULE | Freq: Every day | ORAL | Status: DC
  Administered 2014-02-08 – 2014-02-14 (×7): 120 mg via ORAL
  Filled 2014-02-08 (×8): qty 1

## 2014-02-08 MED ORDER — SODIUM CHLORIDE 0.9 % IV SOLN: 250.0000 mL | INTRAVENOUS | Status: DC | PRN

## 2014-02-08 MED ORDER — ATORVASTATIN CALCIUM 10 MG PO TABS
10.0000 mg | ORAL_TABLET | Freq: Every day | ORAL | Status: DC
  Administered 2014-02-09 – 2014-02-13 (×5): 10 mg via ORAL
  Filled 2014-02-08 (×5): qty 1

## 2014-02-08 MED ORDER — SODIUM CHLORIDE 0.9 % IJ SOLN
3.0000 mL | Freq: Two times a day (BID) | INTRAMUSCULAR | Status: DC
  Administered 2014-02-08 – 2014-02-14 (×10): 3 mL via INTRAVENOUS

## 2014-02-08 MED ORDER — SODIUM CHLORIDE 0.9 % IJ SOLN: 3.0000 mL | INTRAMUSCULAR | Status: DC | PRN

## 2014-02-08 NOTE — Progress Notes (Signed)
Found pt with legs through side rails on the right side of the bed.  Assisted pt back to bed with help from another RN, place safety mats on both sides of the bed.  Alerted charge nurse to sitter need., as pt trying to crawl out of bed.  Bed in lowest position, call bell in reach.  Will continue to monitor.

## 2014-02-08 NOTE — Clinical Social Work Note (Signed)
CSW [facility]. CSW inquired about contact information for patient's niece and nephew.  Mrs. Everardo Allllison indicated that the numbers for patient's niece and nephew were old and not working numbers.  She stated that patient has a cousin in the facility and that she was going to attempt to get the information from patient's cousin's family.  She stated that she would leave the numbers on CSW's voicemail.  Tretha SciaraHeather [place], LCSW [phone]

## 2014-02-08 NOTE — Progress Notes (Signed)
ANTIBIOTIC CONSULT NOTE - follow up  Pharmacy Consult for Levaquin Indication: pneumonia  No Known Allergies  Patient Measurements: Height: 5\' 4"  (162.6 cm) Weight: 99 lb 3.3 oz (45 kg) IBW/kg (Calculated) : 54.7  Vital Signs: Temp: 98.1 F (36.7 C) (10/21 0541) Temp Source: Axillary (10/21 0541) BP: 110/72 mmHg (10/21 0541) Pulse Rate: 77 (10/21 0541) Intake/Output from previous day: 10/20 0701 - 10/21 0700 In: 1961.3 [P.O.:240; I.V.:1721.3] Out: -  Intake/Output from this shift: Total I/O In: 120 [P.O.:120] Out: -   Labs:  Recent Labs  02/06/14 0835 02/07/14 0552  WBC 9.7 6.0  HGB 10.5* 9.0*  PLT 157 137*  CREATININE 1.36* 1.20*   Estimated Creatinine Clearance: 19.5 ml/min (by C-G formula based on Cr of 1.2). No results found for this basename: VANCOTROUGH, Leodis BinetVANCOPEAK, VANCORANDOM, GENTTROUGH, GENTPEAK, GENTRANDOM, TOBRATROUGH, TOBRAPEAK, TOBRARND, AMIKACINPEAK, AMIKACINTROU, AMIKACIN,  in the last 72 hours   Microbiology: Recent Results (from the past 720 hour(s))  MRSA PCR SCREENING     Status: None   Collection Time    02/06/14 10:00 PM      Result Value Ref Range Status   MRSA by PCR NEGATIVE  NEGATIVE Final   Comment:            The GeneXpert MRSA Assay (FDA     approved for NASAL specimens     only), is one component of a     comprehensive MRSA colonization     surveillance program. It is not     intended to diagnose MRSA     infection nor to guide or     monitor treatment for     MRSA infections.   Medical History: Past Medical History  Diagnosis Date  . Organic brain syndrome   . Hard of hearing   . Chronic alcohol abuse   . Hyperlipidemia   . Fall   . CKD (chronic kidney disease), stage III   . CVA (cerebral infarction)   . Dementia   . Compression fx, lumbar spine     L1 and L2 2015   Medications:  Scheduled:  . aspirin EC  81 mg Oral Daily  . atorvastatin  10 mg Oral q1800  . diltiazem  120 mg Oral Daily  . docusate sodium   100 mg Oral QHS  . feeding supplement (ENSURE COMPLETE)  237 mL Oral BID BM  . feeding supplement (ENSURE)  1 Container Oral TID BM  . heparin  5,000 Units Subcutaneous 3 times per day  . levofloxacin (LEVAQUIN) IV  500 mg Intravenous Q48H  . sodium chloride  3 mL Intravenous Q12H  . sodium chloride  3 mL Intravenous Q12H  . thioridazine  10 mg Oral BID   Assessment: Questionable infiltrates on chest x-ray. Levaquin ordered and renally adjusted. Currently afebrile.  Goal of Therapy:  Eradicate infection  Plan:  Levaquin 500 mg IV every 48 hours Monitor renal function Labs per protocol F/U cultures and LOT  Valrie HartHall, Lanetra Hartley A 02/08/2014,12:32 PM

## 2014-02-08 NOTE — Progress Notes (Signed)
Subjective: The patient's condition remained stable. She is responsive. She does have the following problems laceration of scalp and hematoma of right sided head, C2 cervical fracture, anemia of chronic disease, CK D. stage III, hyperlipidemia, organic brain syndrome, compression fracture lumbar spine, history CVA with multiple lacunar infarct remote left cerebral infarct.  Objective: Vital signs in last 24 hours: Temp:  [97.1 F (36.2 C)-98.4 F (36.9 C)] 98.1 F (36.7 C) (10/21 0541) Pulse Rate:  [77-100] 77 (10/21 0541) Resp:  [20] 20 (10/21 0541) BP: (105-155)/(71-77) 110/72 mmHg (10/21 0541) SpO2:  [100 %] 100 % (10/21 0541) Weight change:  Last BM Date: 02/06/14  Intake/Output from previous day: 10/20 0701 - 10/21 0700 In: 1961.3 [P.O.:240; I.V.:1721.3] Out: -  Intake/Output this shift:    Physical Exam: General appearance patient is responsive and fairly comfortable  Eyes PERRLA-bruise right upper and lower lid  ENT negative  Neck Aspen collar in place  Heart regular rhythm no murmurs  Lungs clear to P&A  Abdomen the palpable organs or masses  Skin patient has laceration right side of head no other skin abnormalities  Neurological cranial nerves intact no motor or sensory abnormalities   Recent Labs  02/06/14 0835 02/07/14 0552  WBC 9.7 6.0  HGB 10.5* 9.0*  HCT 30.9* 26.9*  PLT 157 137*   BMET  Recent Labs  02/06/14 0835 02/06/14 1437 02/07/14 0552  NA 142  --  142  K 5.9* 5.7* 4.3  CL 108  --  108  CO2 22  --  21  GLUCOSE 114*  --  100*  BUN 34*  --  25*  CREATININE 1.36*  --  1.20*  CALCIUM 9.0  --  8.1*    Studies/Results: Dg Pelvis 1-2 Views  02/06/2014   CLINICAL DATA:  Status post fall last night. Patient found down. Initial encounter.  EXAM: PELVIS - 1-2 VIEW  COMPARISON:  Plain films of the hips 08/29/2013.  FINDINGS: No acute bony or joint abnormality is identified. Intra medullary nail fixing an old healed left femur fracture is  unchanged in appearance.  IMPRESSION: No acute abnormality.   Electronically Signed   By: Drusilla Kannerhomas  Dalessio M.D.   On: 02/06/2014 09:32   Ct Head Wo Contrast  02/06/2014   CLINICAL DATA:  Fall during the night found on floor of nursing facility on her right side; laceration to rt side of face and nose  EXAM: CT HEAD WITHOUT CONTRAST  CT MAXILLOFACIAL WITHOUT CONTRAST  CT CERVICAL SPINE WITHOUT CONTRAST  TECHNIQUE: Multidetector CT imaging of the head, cervical spine, and maxillofacial structures were performed using the standard protocol without intravenous contrast. Multiplanar CT image reconstructions of the cervical spine and maxillofacial structures were also generated.  COMPARISON:  Head CT 11/12/2013.  FINDINGS: CT HEAD FINDINGS  No intracranial hemorrhage. No parenchymal contusion. No midline shift or mass effect. Basilar cisterns are patent. No skull base fracture. No fluid in the paranasal sinuses or mastoid air cells. Orbits are normal.  Remote infarction in the left cerebellum. Generalized cortical atrophy. This extensive periventricular and subcortical white matter hypodensities.  There is scalp hematoma midline over the right frontal bone. No associated skull fracture.  There is fluid in the right maxillary sinus.  CT MAXILLOFACIAL FINDINGS  Right frontal scalp hematoma. No evidence of orbital fracture. Zygomatic arches are intact. No maxillary bone fracture. Pterygoid plates are intact. Smaller fluid in the right maxillary sinus. Mandibular condyles are located. There is motion artifact which blurs the mandible. Repeat  images performed through the mandible with persistent abnormality. There is step-off of the mandibular bodies bilaterally and more prominent on the left.  On the coronal images air are system and interbody difficult cell this is motion artifact or fracture.  CT CERVICAL SPINE FINDINGS  There is a fracture through the midbody of the dens with posterior displacement of the upper dens by  5 mm. There are nondisplaced vertical fractures through the left and right neural arch of C2 seen on image 16, series 16. No epidural hematoma. No significant prevertebral soft tissue swelling. The C3 through T1 vertebral bodies are normal without evidence of fracture or subluxation. No epidural paraspinal hematoma.  IMPRESSION: 1. Right frontal scalp hematoma. 2. No evidence of intracranial trauma. 3. Concern for mandibular rami fractures bilaterally and more prominent on the left. There is significant motion artifact through this region. Recommend clinical correlation. 4. Acute fracture of the C2 body of the dens as well as the neural arch of C2 on the left and right. Findings conveyed toIVA KNAPP on 02/06/2014  at09:48.   Electronically Signed   By: Genevive Bi M.D.   On: 02/06/2014 09:48   Ct Cervical Spine Wo Contrast  02/06/2014   CLINICAL DATA:  Fall during the night found on floor of nursing facility on her right side; laceration to rt side of face and nose  EXAM: CT HEAD WITHOUT CONTRAST  CT MAXILLOFACIAL WITHOUT CONTRAST  CT CERVICAL SPINE WITHOUT CONTRAST  TECHNIQUE: Multidetector CT imaging of the head, cervical spine, and maxillofacial structures were performed using the standard protocol without intravenous contrast. Multiplanar CT image reconstructions of the cervical spine and maxillofacial structures were also generated.  COMPARISON:  Head CT 11/12/2013.  FINDINGS: CT HEAD FINDINGS  No intracranial hemorrhage. No parenchymal contusion. No midline shift or mass effect. Basilar cisterns are patent. No skull base fracture. No fluid in the paranasal sinuses or mastoid air cells. Orbits are normal.  Remote infarction in the left cerebellum. Generalized cortical atrophy. This extensive periventricular and subcortical white matter hypodensities.  There is scalp hematoma midline over the right frontal bone. No associated skull fracture.  There is fluid in the right maxillary sinus.  CT  MAXILLOFACIAL FINDINGS  Right frontal scalp hematoma. No evidence of orbital fracture. Zygomatic arches are intact. No maxillary bone fracture. Pterygoid plates are intact. Smaller fluid in the right maxillary sinus. Mandibular condyles are located. There is motion artifact which blurs the mandible. Repeat images performed through the mandible with persistent abnormality. There is step-off of the mandibular bodies bilaterally and more prominent on the left.  On the coronal images air are system and interbody difficult cell this is motion artifact or fracture.  CT CERVICAL SPINE FINDINGS  There is a fracture through the midbody of the dens with posterior displacement of the upper dens by 5 mm. There are nondisplaced vertical fractures through the left and right neural arch of C2 seen on image 16, series 16. No epidural hematoma. No significant prevertebral soft tissue swelling. The C3 through T1 vertebral bodies are normal without evidence of fracture or subluxation. No epidural paraspinal hematoma.  IMPRESSION: 1. Right frontal scalp hematoma. 2. No evidence of intracranial trauma. 3. Concern for mandibular rami fractures bilaterally and more prominent on the left. There is significant motion artifact through this region. Recommend clinical correlation. 4. Acute fracture of the C2 body of the dens as well as the neural arch of C2 on the left and right. Findings conveyed toIVA KNAPP on 02/06/2014  at09:48.   Electronically Signed   By: Genevive Bi M.D.   On: 02/06/2014 09:48   Dg Chest Portable 1 View  02/06/2014   CLINICAL DATA:  Unwitnessed fall last night with patient found on floor this morning with head laceration; history of previous CVA, organic brain syndrome, and chronic alcohol abuse.  EXAM: PORTABLE CHEST - 1 VIEW  COMPARISON:  Limited views of the chest from any thoracic spine series of Aug 29, 2013  FINDINGS: The lungs are adequately inflated. There are coarse infrahilar lung markings on the  left. The cardiac silhouette is top-normal in size. The pulmonary vascularity is not engorged. There is mild tortuosity of the descending thoracic aorta. There is mild soft tissue fullness in the upper right paratracheal region which may be accentuated by patient rotation. There is no pleural effusion. There is superior positioning of the right humeral head with respect to the glenoid which may reflect previous rotator cuff arthropathy.  IMPRESSION: 1. There are coarse left infrahilar lung markings which may reflect atelectasis or pneumonia. There is no evidence of CHF. 2. There are degenerative changes of the right shoulder with superior migration of the humeral head. This is only partially included in the field of view. 3. There is soft tissue fullness in the right paratracheal region. This is of uncertain etiology and was not visible on the previous study which was markedly different in positioning. When the patient can tolerate the procedure, a well-positioned PA chest x-ray would be useful. Alternatively, CT scanning of the chest could be considered.   Electronically Signed   By: David  [place]   On: 02/06/2014 15:39   Dg Shoulder Left Port  02/06/2014   CLINICAL DATA:  Status post fall this morning. Left shoulder pain. Initial encounter.  EXAM: LEFT SHOULDER - 1 VIEW  COMPARISON:  None.  FINDINGS: The humeral head is high-riding consistent with chronic rotator cuff tear. The humeral head appears anteriorly subluxed on the Y-view. There is sclerosis of the articular surface the humeral head and adjacent coracoid process suggesting chronic change. No fracture is identified. Mild acromioclavicular degenerative change is noted.  IMPRESSION: Abnormal appearance of left shoulder most suggestive of chronic anterior subluxation of the humeral head and chronic rotator cuff tear.  Negative for fracture.   Electronically Signed   By: Drusilla Kanner M.D.   On: 02/06/2014 12:48   Ct Maxillofacial Wo Cm  02/06/2014    CLINICAL DATA:  Fall during the night found on floor of nursing facility on her right side; laceration to rt side of face and nose  EXAM: CT HEAD WITHOUT CONTRAST  CT MAXILLOFACIAL WITHOUT CONTRAST  CT CERVICAL SPINE WITHOUT CONTRAST  TECHNIQUE: Multidetector CT imaging of the head, cervical spine, and maxillofacial structures were performed using the standard protocol without intravenous contrast. Multiplanar CT image reconstructions of the cervical spine and maxillofacial structures were also generated.  COMPARISON:  Head CT 11/12/2013.  FINDINGS: CT HEAD FINDINGS  No intracranial hemorrhage. No parenchymal contusion. No midline shift or mass effect. Basilar cisterns are patent. No skull base fracture. No fluid in the paranasal sinuses or mastoid air cells. Orbits are normal.  Remote infarction in the left cerebellum. Generalized cortical atrophy. This extensive periventricular and subcortical white matter hypodensities.  There is scalp hematoma midline over the right frontal bone. No associated skull fracture.  There is fluid in the right maxillary sinus.  CT MAXILLOFACIAL FINDINGS  Right frontal scalp hematoma. No evidence of orbital fracture. Zygomatic arches are intact.  No maxillary bone fracture. Pterygoid plates are intact. Smaller fluid in the right maxillary sinus. Mandibular condyles are located. There is motion artifact which blurs the mandible. Repeat images performed through the mandible with persistent abnormality. There is step-off of the mandibular bodies bilaterally and more prominent on the left.  On the coronal images air are system and interbody difficult cell this is motion artifact or fracture.  CT CERVICAL SPINE FINDINGS  There is a fracture through the midbody of the dens with posterior displacement of the upper dens by 5 mm. There are nondisplaced vertical fractures through the left and right neural arch of C2 seen on image 16, series 16. No epidural hematoma. No significant prevertebral  soft tissue swelling. The C3 through T1 vertebral bodies are normal without evidence of fracture or subluxation. No epidural paraspinal hematoma.  IMPRESSION: 1. Right frontal scalp hematoma. 2. No evidence of intracranial trauma. 3. Concern for mandibular rami fractures bilaterally and more prominent on the left. There is significant motion artifact through this region. Recommend clinical correlation. 4. Acute fracture of the C2 body of the dens as well as the neural arch of C2 on the left and right. Findings conveyed toIVA KNAPP on 02/06/2014  at09:48.   Electronically Signed   By: Genevive BiStewart  Edmunds M.D.   On: 02/06/2014 09:48    Medications:  . aspirin EC  81 mg Oral Daily  . docusate sodium  100 mg Oral QHS  . feeding supplement (ENSURE COMPLETE)  237 mL Oral BID BM  . feeding supplement (ENSURE)  1 Container Oral TID BM  . heparin  5,000 Units Subcutaneous 3 times per day  . levofloxacin (LEVAQUIN) IV  500 mg Intravenous Q48H  . simvastatin  20 mg Oral QPM  . sodium chloride  3 mL Intravenous Q12H  . thioridazine  10 mg Oral BID        Assessment/Plan: 1. Head injury following fall, laceration right side of scalp and hematoma-continue to monitor her vital signs  2. Possible early pneumonia lower lobe hyperinflated continue IV Levaquin 750 mg every 24 hours. Will repeat chest x-ray in a.m.  3. C2 cervical fracture-continue Aspen collar-will followup with Dr. Lovell SheehanJenkins in one month  4. CK D. stage III-patient at baseline continue IV fluids continue to monitor chemistries  5. Prior CVA with organic brain syndrome-patient at baseline to continue supportive care. Patient has history of multiple lacunar infarcts and cerebral infarction  6. Compression fracture lumbar spine thought to be chronic   LOS: 2 days   Kendra [name] G 02/08/2014, 6:04 AM

## 2014-02-08 NOTE — Progress Notes (Signed)
Pt had 17 bt run of Vtach at 922am this AM.  Pt turning in bed frequently with lots of artifact on CV.  Notified Dr Renard MatterMcinnis of [facility] FunkVtach, received order for cardizem 120mg  po daily.  Will continue to monitor.

## 2014-02-09 LAB — BASIC METABOLIC PANEL
Anion gap: 12 (ref 5–15)
BUN: 24 mg/dL — ABNORMAL HIGH (ref 6–23)
CALCIUM: 8.1 mg/dL — AB (ref 8.4–10.5)
CHLORIDE: 108 meq/L (ref 96–112)
CO2: 22 mEq/L (ref 19–32)
CREATININE: 1.33 mg/dL — AB (ref 0.50–1.10)
GFR calc non Af Amer: 33 mL/min — ABNORMAL LOW (ref >90)
GFR, EST AFRICAN AMERICAN: 38 mL/min — AB (ref >90)
Glucose, Bld: 77 mg/dL (ref 70–99)
Potassium: 4.3 mEq/L (ref 3.7–5.3)
Sodium: 142 mEq/L (ref 137–147)

## 2014-02-09 NOTE — Progress Notes (Signed)
Subjective: The patient's condition remained stable. She did have a short run of V. tach yesterday. She does have the following problems laceration of scalp and hematoma of right side of head. She has fractured her C2 cervical vertebra, chronic anemia, CK D. stage III, hyperlipidemia, organic brain syndrome, compression fracture lumbar spine which is old. Chest prior history CVA with multiple lacunar infarctions and remote cerebral infarction on left. She's been treated for possible early pneumonia.  Objective: Vital signs in last 24 hours: Temp:  [98 F (36.7 C)-98.7 F (37.1 C)] 98 F (36.7 C) (10/22 0501) Pulse Rate:  [75-108] 76 (10/22 0501) Resp:  [20] 20 (10/22 0501) BP: (143-165)/(80-98) 165/91 mmHg (10/22 0501) SpO2:  [97 %-100 %] 97 % (10/22 0501) Weight change:  Last BM Date: 02/06/14  Intake/Output from previous day: 10/21 0701 - 10/22 0700 In: 120 [P.O.:120] Out: -  Intake/Output this shift:    Physical Exam: General appearance-patient is responsive and fairly comfortable  Eyes PERRLA-bruise right upper and lower lid  ENT negative  Neck Aspen collar in place  Heart regular rhythm no murmurs  Lungs clear to P&A  Abdomen no palpable organs or masses  Skin patient has laceration of right side of head which is been sutured  Neurological cranial nerves intact no motor or sensory abnormalities   Recent Labs  02/06/14 0835 02/07/14 0552  WBC 9.7 6.0  HGB 10.5* 9.0*  HCT 30.9* 26.9*  PLT 157 137*   BMET  Recent Labs  02/06/14 0835 02/06/14 1437 02/07/14 0552  NA 142  --  142  K 5.9* 5.7* 4.3  CL 108  --  108  CO2 22  --  21  GLUCOSE 114*  --  100*  BUN 34*  --  25*  CREATININE 1.36*  --  1.20*  CALCIUM 9.0  --  8.1*    Studies/Results: No results found.  Medications:  . aspirin EC  81 mg Oral Daily  . atorvastatin  10 mg Oral q1800  . diltiazem  120 mg Oral Daily  . docusate sodium  100 mg Oral QHS  . feeding supplement (ENSURE  COMPLETE)  237 mL Oral BID BM  . feeding supplement (ENSURE)  1 Container Oral TID BM  . heparin  5,000 Units Subcutaneous 3 times per day  . levofloxacin (LEVAQUIN) IV  500 mg Intravenous Q48H  . sodium chloride  3 mL Intravenous Q12H  . sodium chloride  3 mL Intravenous Q12H  . thioridazine  10 mg Oral BID        Assessment/Plan: 1. Head injury following fall with laceration to right side of scalp and hematoma-plan to continue wound care  2. Possible early pneumonia lower lobe-plan to continue IV Levaquin 750 mg daily-will repeat chest x-ray today  3. C2 cervical fracture-continue Aspen collar-will follow up with with Dr. Lovell SheehanJenkins in one month  4. CK D. stage III patient at baseline will continue IV fluids continue to monitor chemistries  5. Prior CVA with organic brain syndrome-patient at baseline to continue supportive care. Patient has a history of multiple lacunar infarctions renal infarction  6. Compression fracture lumbar spine thought to be chronic   LOS: 3 days   [name] G 02/09/2014, 6:10 AM

## 2014-02-09 NOTE — Clinical Social Work Note (Signed)
CSW contacted [facility], [facility], who indicated that another facility staff Angelena Form(Dinisha) had spoke to the patient's niece.  She stated that she thought that this staff member had contacted CSW and provided the number.  CSW indicated that she had not received the number.  Mrs. Everardo Allllison indicated that she would contact CSW with the information.  Mrs. Everardo Allllison indicated that she was aware that patient's care needs would be increased upon discharge. She indicated that the facility was aware of this and that the facility remained agreeable to patient coming back upon discharge.    Tretha SciaraHeather Ethanjames Fontenot, LCSW [phone]

## 2014-02-09 NOTE — Clinical Social Work Psych Note (Signed)
CSW contacted patient's daughter-in-law, Rev. France RavensLouise Godman, [phone].  She indicated that patient has been at the facility for over ten years.  She indicated that patient had three children, however they are all deceased.  She indicated that she checks on patient weekly via phone at the facility.  Rev. Clinton SawyerWilliamson indicated that they visit patient when they pass through the area.  She stated that the family has cousins in the area that check on patient more frequently.  Rev. Clinton SawyerWilliamson indicated that when she called the facility last week she learned that patient had fell and that when she called this week and learned that patient had fallen again.  Rev. Clinton SawyerWilliamson indicated that (her  Daughter), patient's granddaughter, is a traveling Engineer, civil (consulting)nurse and that she would discuss the situation with patient's granddaughter and contact CSW about further arrangements.  CSW provided Rev. Clinton SawyerWilliamson with her contact number.    Tretha SciaraHeather Dashae Wilcher, [place] [phone]

## 2014-02-10 MED ORDER — LEVOFLOXACIN 500 MG PO TABS
500.0000 mg | ORAL_TABLET | ORAL | Status: DC
Start: 2014-02-10 — End: 2014-02-14
  Administered 2014-02-10 – 2014-02-12 (×2): 500 mg via ORAL
  Filled 2014-02-10 (×2): qty 1

## 2014-02-10 NOTE — Progress Notes (Signed)
Physical Therapy Treatment Patient Details Name: Kendra [name] MRN: [id number] DOB: 1915-06-20 Today's Date: 02/10/2014    History of Present Illness Kendra [name] is a very pleasant 80 y.o. female who presents to ED with chief complaint fall. EMS reported patient fell at nursing facility at unknown time. Initial evaluation reveal C2 fracture, hyperkalemia, acute on chronic kidney failure and hypothermia.  There are degenerative changes of the right shoulder with superior migration of the humeral head. This is only partially included in the field of view. There is soft tissue fullness in the right paratracheal region.  Also has C2 cervical fracture has Aspen collar in place. She does have other medical problems. These are anemia related to chronic disease, CK D. stage III, hyperlipidemia, organic brain syndrome, compression fracture lumbar spine, history of CVA with multiple lacunar infarcts and remote left cerebral infarct.     PT Comments    Pt agreeable to therapy. Pt did have difficulty following commands due to [facility] and cognitive impairments. Pt was able to sit up EOB and complete a simple reaching task. Pt ambulated Mod assist. 7320ft. Pt demonstrated poor balance and required therapist assist to prevent a fall. I feel pt may need increased level of care and recommend d/c to SNF facility if family unable to provide 24 hour physical assist. Pt is a very high fall risk if left alone.  Follow Up Recommendations  SNF;Home health PT;Supervision/Assistance - 24 hour     Equipment Recommendations  None recommended by PT    Recommendations for Other Services       Precautions / Restrictions Precautions Precautions: Fall;Cervical Required Braces or Orthoses: Cervical Brace Restrictions Weight Bearing Restrictions: No    Mobility  Bed Mobility Overal bed mobility: Needs Assistance Bed Mobility: Supine to Sit;Sit to Supine     Supine to sit: Mod assist;HOB elevated Sit to  supine: Min assist;HOB elevated   General bed mobility comments: Assistance with trunk support and initating movement.  Transfers Overall transfer level: Needs assistance Equipment used: None Transfers: Sit to/from Stand           General transfer comment: mod assist verbal and tactile cues for technique, pt very HOH and presents with cognitive defecits affecting her ability to follow commands.   Ambulation/Gait Ambulation/Gait assistance: Mod assist Ambulation Distance (Feet): 24 Feet Assistive device: 1 person hand held assist Gait Pattern/deviations: Step-to pattern;Decreased stride length;Scissoring;Staggering left;Staggering right;Trunk flexed;Narrow base of support Gait velocity: decreased Gait velocity interpretation: Below normal speed for age/gender General Gait Details: Pt very unsteady requiring therapist support to prevent from falling. Pt made no attempt to correct balance. Pt did fatigue quickly and wanted to sit down. Pt ambulated with bilateral knees in flexion and trunk flexed.   Stairs            Wheelchair Mobility    Modified Rankin (Stroke Patients Only)       Balance Overall balance assessment: Needs assistance   Sitting balance-Leahy Scale: Fair Sitting balance - Comments: reaching task EOB with feet supported. Min assist for balance. Pt has very limited UE ROM. Pt was able to shift wt over BOS to complete a reaching task with the target close to midline.   Standing balance support: During functional activity Standing balance-Leahy Scale: Poor                      Cognition Arousal/Alertness: Awake/alert Behavior During Therapy: Flat affect Overall Cognitive Status: No family/caregiver present to determine baseline cognitive  functioning                      Exercises      General Comments        Pertinent Vitals/Pain Pain Assessment: Faces Faces Pain Scale: Hurts a little bit Pain Location: unable to state Pain  Intervention(s): Limited activity within patient's tolerance    Home Living                      Prior Function            PT Goals (current goals can now be found in the care plan section) Progress towards PT goals: Progressing toward goals    Frequency  Min 2X/week    PT Plan Discharge plan needs to be updated    Co-evaluation             End of Session Equipment Utilized During Treatment: Gait belt Activity Tolerance: Patient limited by fatigue Patient left: in bed;with call bell/phone within reach;with bed alarm set;with nursing/sitter in room     Time: [id number] PT Time Calculation (min): 26 min  Charges:  $Gait Training: 8-22 mins $Therapeutic Activity: 8-22 mins                    G Codes:      Greggory StallionWrisley, Jyllian Haynie Kerstine 02/10/2014, 12:34 PM

## 2014-02-10 NOTE — Clinical Social Work Note (Addendum)
CSW spoke with patient's granddaughter, Kendra CraneJoy Archambeau  [name] [phone].  Kendra [name] indicated that she typically sees patient about once yearly having most recently seen patient this past May.  She indicated that she learned of her patient's fall about two days ago.  Kendra [name] indicated that she is unsure if she is comfortable with patient returning to the family care home as she may need more care than they can provide.  Kendra [name] requested that CSW provide her a list of facilities as she would like to contact facilities to explore options for patient.   CSW emailed Kendra [name] the SNF list for the area.    CSW spoke to patient's granddaughter who gave CSW permission to send clinicals to area facilities. Granddaughter indicated that she had spoke with [name].  Kendra [name] indicated that she remained unsure as to whether she felt comfortable with patient going back to facility.  Kendra [name] also requested that patient's nurse contact her. CSW provided patient's nurse with Kendra [name]'s contact information and requested contact.   CSW spoke to Dr. Renard MatterMcInnis related to patient's discharge.  Dr. Renard MatterMcInnis requested that CSW contact him once bed offers have been made and accepted.   CSW spoke to patient's granddaughter who indicated that she did not want patient to return to [name] at discharge.  CSW advised granddaughter that patient's clinicals had been [facility] to [facility] [place], [facility], [facility]  Kendra [name] was appreciative of this. CSW encouraged Kendra [name] to contact these facilities and review their websites.  Kendra [name] agreed. CSW advised Kendra [name] that she would be notified with bed offers.   Tretha SciaraHeather Ilo Beamon, [place] [phone]

## 2014-02-10 NOTE — Progress Notes (Signed)
Subjective: The patient's condition remained stable. She's had no further runs of V. tach. She has the following problems which is slowly improving laceration of scalp and hematoma right-sided head. She also has fractured C2 cervical vertebra, chronic anemia, CAD stage III, hyperlipidemia, organic brain syndrome, compression fracture of lumbar spine which is old CVA showing multiple lacunar infarctions and remote cerebral infarction with left. She is being treated for early pneumonia  Objective: Vital signs in last 24 hours: Temp:  [97.3 F (36.3 C)-98.9 F (37.2 C)] 98.9 F (37.2 C) (10/22 2205) Pulse Rate:  [63-106] 77 (10/22 2205) Resp:  [18] 18 (10/22 2205) BP: (127-183)/(67-85) 183/69 mmHg (10/22 2205) SpO2:  [100 %] 100 % (10/22 1300) Weight change:  Last BM Date: 02/06/14  Intake/Output from previous day: 10/22 0701 - 10/23 0700 In: 360 [P.O.:360] Out: -  Intake/Output this shift:    Physical Exam: General appearance patient is responsive and fairly comfortable  Eyes PERRLA-bruise of right upper and lower lid  EENT negative  Neck Aspen collar in place  Heart regular rhythm no murmurs  Lungs clear to P&A  Abdomen no palpable organs or masses  Skin patient has laceration right side of head which is been sutured  Neurological cranial nerves intact no motor or sensory abnormalities    No results found for this basename: WBC, HGB, HCT, PLT,  in the last 72 hours BMET  Recent Labs  02/09/14 0546  NA 142  K 4.3  CL 108  CO2 22  GLUCOSE 77  BUN 24*  CREATININE 1.33*  CALCIUM 8.1*    Studies/Results: No results found.  Medications:  . aspirin EC  81 mg Oral Daily  . atorvastatin  10 mg Oral q1800  . diltiazem  120 mg Oral Daily  . docusate sodium  100 mg Oral QHS  . feeding supplement (ENSURE COMPLETE)  237 mL Oral BID BM  . feeding supplement (ENSURE)  1 Container Oral TID BM  . heparin  5,000 Units Subcutaneous 3 times per day  . levofloxacin  (LEVAQUIN) IV  500 mg Intravenous Q48H  . sodium chloride  3 mL Intravenous Q12H  . sodium chloride  3 mL Intravenous Q12H  . thioridazine  10 mg Oral BID        Assessment/Plan: 1. She was injured following fall with laceration of right side of scalp and hematoma-plan to continue wound care  2. Possible early pneumonia in lower lobe-plan to continue IV Levaquin 750 mg daily 2 repeat chest x-ray  3. C2 cervical fracture-continue Aspen collar-patient will followup with Dr. Lovell SheehanJenkins in one month  4. CK D. stage III-patient is at baseline will continue IV fluids continue to monitor chemistries  5. Prior CVA with organic brain syndrome-patient at baseline will continue supportive care. Patient has history of multiple lacunar infarctions condition is stable  6. Compression fracture lumbar spine thought to be chronic    LOS: 4 days   Heaven Meeker G 02/10/2014, 6:23 AM

## 2014-02-10 NOTE — Clinical Social Work Placement (Signed)
Clinical Social Work Department CLINICAL SOCIAL WORK PLACEMENT NOTE 02/10/2014  Patient:  Kendra [name],[name]  Account Number:  [id number] Admit date:  02/06/2014  Clinical Social Worker:  Derenda FennelKARA Elyce Zollinger, LCSW  Date/time:  02/10/2014 03:42 PM  Clinical Social Work is seeking post-discharge placement for this patient at the following level of care:   SKILLED NURSING   (*CSW will update this form in Epic as items are completed)   02/10/2014  Patient/family provided with [facility] Department of Clinical Social Work's list of facilities offering this level of care within the geographic area requested by the patient (or if unable, by the patient's family).  02/10/2014  Patient/family informed of their freedom to choose among providers that offer the needed level of care, that participate in Medicare, Medicaid or managed care program needed by the patient, have an available bed and are willing to accept the patient.  02/10/2014  Patient/family informed of [facility]' ownership interest in [facility], as well as of the fact that they are under no obligation to receive care at this facility.  PASARR submitted to EDS on 02/07/2014 PASARR number received on 02/07/2014  FL2 transmitted to all facilities in geographic area requested by pt/family on  02/10/2014 FL2 transmitted to all facilities within larger geographic area on   Patient informed that his/her managed care company has contracts with or will negotiate with  certain facilities, including the following:     Patient/family informed of bed offers received:  02/10/2014 Patient chooses bed at  Physician recommends and patient chooses bed at    Patient to be transferred to  on   Patient to be transferred to facility by  Patient and family notified of transfer on  Name of family member notified:    The following physician request were entered in Epic:   Additional Comments:  Derenda FennelKara Ashon Rosenberg, LCSW [phone]

## 2014-02-10 NOTE — Progress Notes (Signed)
Patient is resting well in bed. Sitter at bedside. Patient is incontinent of bowel and bladder. Bruises to face are improving. Patient alert to name but, denies any pain. Will continue to monitor.

## 2014-02-10 NOTE — Progress Notes (Signed)
ANTIBIOTIC CONSULT NOTE - follow up  Pharmacy Consult for Levaquin Indication: pneumonia  No Known Allergies  Patient Measurements: Height: 5\' 4"  (162.6 cm) Weight: 99 lb 3.3 oz (45 kg) IBW/kg (Calculated) : 54.7  Vital Signs: Temp: 97.6 F (36.4 C) (10/23 0825) Temp Source: Axillary (10/23 0825) BP: 127/83 mmHg (10/23 0825) Pulse Rate: 68 (10/23 0825) Intake/Output from previous day: 10/22 0701 - 10/23 0700 In: 360 [P.O.:360] Out: -  Intake/Output from this shift:    Labs:  Recent Labs  02/09/14 0546  CREATININE 1.33*   Estimated Creatinine Clearance: 17.6 ml/min (by C-G formula based on Cr of 1.33). No results found for this basename: VANCOTROUGH, Leodis BinetVANCOPEAK, VANCORANDOM, GENTTROUGH, GENTPEAK, GENTRANDOM, TOBRATROUGH, TOBRAPEAK, TOBRARND, AMIKACINPEAK, AMIKACINTROU, AMIKACIN,  in the last 72 hours   Microbiology: Recent Results (from the past 720 hour(s))  MRSA PCR SCREENING     Status: None   Collection Time    02/06/14 10:00 PM      Result Value Ref Range Status   MRSA by PCR NEGATIVE  NEGATIVE Final   Comment:            The GeneXpert MRSA Assay (FDA     approved for NASAL specimens     only), is one component of a     comprehensive MRSA colonization     surveillance program. It is not     intended to diagnose MRSA     infection nor to guide or     monitor treatment for     MRSA infections.   Medical History: Past Medical History  Diagnosis Date  . Organic brain syndrome   . Hard of hearing   . Chronic alcohol abuse   . Hyperlipidemia   . Fall   . CKD (chronic kidney disease), stage III   . CVA (cerebral infarction)   . Dementia   . Compression fx, lumbar spine     L1 and L2 2015   Medications:  Scheduled:  . aspirin EC  81 mg Oral Daily  . atorvastatin  10 mg Oral q1800  . diltiazem  120 mg Oral Daily  . docusate sodium  100 mg Oral QHS  . feeding supplement (ENSURE COMPLETE)  237 mL Oral BID BM  . feeding supplement (ENSURE)  1 Container  Oral TID BM  . heparin  5,000 Units Subcutaneous 3 times per day  . levofloxacin  500 mg Oral Q48H  . sodium chloride  3 mL Intravenous Q12H  . sodium chloride  3 mL Intravenous Q12H  . thioridazine  10 mg Oral BID   Currently afebrile.  PHARMACIST - PHYSICIAN COMMUNICATION CONCERNING: Antibiotic IV to Oral Route Change Policy  RECOMMENDATION: This patient is receiving Levaquin by the intravenous route.  Based on criteria approved by the Pharmacy and Therapeutics Committee, the antibiotic(s) is/are being converted to the equivalent oral dose form(s).   DESCRIPTION: These criteria include:  Patient being treated for a respiratory tract infection, urinary tract infection, cellulitis or clostridium difficile associated diarrhea if on metronidazole  The patient is not neutropenic and does not exhibit a GI malabsorption state  The patient is eating (either orally or via tube) and/or has been taking other orally administered medications for a least 24 hours  The patient is improving clinically and has a Tmax < 100.5  If you have questions about this conversion, please contact the Pharmacy Department  [x]   [phone]( [phone] )  [facility] []   [phone]( [phone] )  [facility]  []   (  [phone] [facility])  [facility] []   [phone]( [phone] )  [facility]    Kendal Raffo, [place]ott A 02/10/2014,10:39 AM

## 2014-02-10 NOTE — Progress Notes (Signed)
Spoke to the patients granddaughter Kendra [name] for she is the next of kin of the patient.  She wanted to discuss why her grandmother was admitted and what was her plan of care.  Voice to her that the patient has been resting today.  We discussed her diagnosis as listed in the computer only such as what was the admitted Dx and what her temp was on admission.  Her granddaughter verbalized understanding and voiced no further complaints or concerns at this time.  She stated that she would call me later after talking with the rest of the family.

## 2014-02-10 NOTE — Clinical Social Work Note (Signed)
CSW followed up with pt's granddaughter Joy for decision on placement. She is aware that [facility] [place] offer bed as they did not want to look at [facility]. [facility] or [facility] to [facility]. Joy reports that she needs time to consider further. CSW offered to fax out further for more options, but Joy did not indicate she wanted to do this either. Weekend CSW has been asked to follow up with her for decision.   Derenda FennelKara Tonianne Fine, [place] [phone]

## 2014-02-11 NOTE — Progress Notes (Signed)
Subjective: The patient's condition remained stable  she has slowly improved laceration of scalp hematoma right side of head. She also has fractured C2 cervical vertebra, chronic anemia, chronic kidney disease stage III, hyperlipidemia, organic brain syndrome, compression fracture lumbar spine which is old. Also she has prior CVA and multiple lacunar infarctions remote cerebral infarction is being treated for pneumonia.  Objective: Vital signs in last 24 hours: Temp:  [97.5 F (36.4 C)-98.7 F (37.1 C)] 98.7 F (37.1 C) (10/24 0651) Pulse Rate:  [58-73] 63 (10/24 0651) Resp:  [18-19] 18 (10/24 0651) BP: (91-130)/(42-83) 106/66 mmHg (10/24 0651) SpO2:  [99 %-100 %] 100 % (10/24 0651) Weight change:  Last BM Date: 02/06/14  Intake/Output from previous day: 10/23 0701 - 10/24 0700 In: 480 [P.O.:480] Out: -  Intake/Output this shift:    Physical Exam: General appearance-patient is responsive and fairly comfortable  Eyes PERRLA-bruise right upper and lower lid  ENT negative  Neck Aspen collar in place  Heart regular rhythm no murmurs  Lungs clear to P&A  Abdomen no palpable organs or masses  Skin warm and dry-patient has laceration right side of scalp which is healing  Neurological cranial nerves intact no motor or sensory abnormalities  No results found for this basename: WBC, HGB, HCT, PLT,  in the last 72 hours BMET  Recent Labs  02/09/14 0546  NA 142  K 4.3  CL 108  CO2 22  GLUCOSE 77  BUN 24*  CREATININE 1.33*  CALCIUM 8.1*    Studies/Results: No results found.  Medications:  . aspirin EC  81 mg Oral Daily  . atorvastatin  10 mg Oral q1800  . diltiazem  120 mg Oral Daily  . docusate sodium  100 mg Oral QHS  . feeding supplement (ENSURE COMPLETE)  237 mL Oral BID BM  . feeding supplement (ENSURE)  1 Container Oral TID BM  . heparin  5,000 Units Subcutaneous 3 times per day  . levofloxacin  500 mg Oral Q48H  . sodium chloride  3 mL Intravenous Q12H   . sodium chloride  3 mL Intravenous Q12H  . thioridazine  10 mg Oral BID        Assessment/Plan: 1. Injury following fall with laceration right side of scalp and hematoma-plan to continue wound care  2. Pneumonia in lower lobe-plan to continue IV Levaquin 750 mg daily  3. C2 cervical fracture-continue Aspen collar-patient will followup with Dr. Lovell SheehanJenkins in one month  CTD stage III-patient at baseline will continue IV fluids monitor chemistries  5. Prior CVA with organic brain syndrome-patient at baseline will continue supportive care  6. Compression fracture lumbar spine felt to be chronic  Social services are attempting to get patient placed in nursing facility.   LOS: 5 days   Chisa Kushner G 02/11/2014, 7:09 AM

## 2014-02-12 NOTE — Progress Notes (Signed)
Subjective: The patient's condition continues to improve. The scalp laceration and had hematoma are improving. She also has the following problems fracture C2 cervical vertebra, chronic kidney disease stage III, hyperlipidemia, organic brain syndrome, compression fracture of spine which is old prior CVA and multiple lacunar infarctions with remote cerebral infarction. She was admitted with pneumonia this is also improving.  Objective: Vital signs in last 24 hours: Temp:  [97.6 F (36.4 C)-98.7 F (37.1 C)] 97.6 F (36.4 C) (10/25 0552) Pulse Rate:  [53-63] 59 (10/25 0552) Resp:  [18] 18 (10/25 0552) BP: (106-151)/(51-70) 151/70 mmHg (10/25 0552) SpO2:  [99 %-100 %] 99 % (10/25 0552) Weight change:  Last BM Date: 02/11/14  Intake/Output from previous day: 10/24 0701 - 10/25 0700 In: 320 [P.O.:320] Out: -  Intake/Output this shift: Total I/O In: 120 [P.O.:120] Out: -   Physical Exam: General appearance-the patient is responsive and fairly comfortable  Eyes PERRLA-bruise right upper and lower lid  ENT negative  Neck Aspen collar in place  Lungs clear to P&A  Abdomen no palpable organs or masses  Skin warm and dry-patient has laceration which is healing right side of scalp  Neurological cranial nerves intact no motor or sensory abnormalities  No results found for this basename: WBC, HGB, HCT, PLT,  in the last 72 hours BMET No results found for this basename: NA, K, CL, CO2, GLUCOSE, BUN, CREATININE, CALCIUM,  in the last 72 hours  Studies/Results: No results found.  Medications:  . aspirin EC  81 mg Oral Daily  . atorvastatin  10 mg Oral q1800  . diltiazem  120 mg Oral Daily  . docusate sodium  100 mg Oral QHS  . feeding supplement (ENSURE COMPLETE)  237 mL Oral BID BM  . feeding supplement (ENSURE)  1 Container Oral TID BM  . heparin  5,000 Units Subcutaneous 3 times per day  . levofloxacin  500 mg Oral Q48H  . sodium chloride  3 mL Intravenous Q12H  . sodium  chloride  3 mL Intravenous Q12H  . thioridazine  10 mg Oral BID        Assessment/Plan: 1. Injury following fall with laceration o and hematoma-f right side of scalp-will attempt to get sutures out today  2. Pneumonia in lower lobe-plan to continue IV Levaquin 750 mg daily  CKG stage III-patient baseline continue fluids  4. C2 cervical fracture-continue Aspen collar-arrange followup with Dr. Lovell SheehanJenkins in one month  5. Prior CVA with organic brain syndrome-patient at baseline will continue supportive care  6. Compression fracture lumbar spine felt to be chronic  Patient is awaiting bed placement and nursing facility    LOS: 6 days   Deshannon Hinchliffe G 02/12/2014, 6:40 AM

## 2014-02-13 NOTE — Progress Notes (Signed)
Subjective: The patient's condition continues to improve. The scalp laceration and hematoma are improving. She has a following problems as well fracture C2 cervical vertebra, chronic kidney disease stage III, hyperlipidemia, organic brain syndrome, compression fracture spine which is old prior CVA and multiple lacunar infarctions with remote cerebral infarction. She was admitted with pneumonia this is improving  Objective: Vital signs in last 24 hours: Temp:  [97.5 F (36.4 C)-98.2 F (36.8 C)] 97.7 F (36.5 C) (10/26 0604) Pulse Rate:  [61-76] 76 (10/26 0604) Resp:  [18] 18 (10/26 0604) BP: (104-145)/(50-72) 145/69 mmHg (10/26 0604) SpO2:  [95 %-100 %] 100 % (10/26 0604) Weight change:  Last BM Date: 02/11/14  Intake/Output from previous day: 10/25 0701 - 10/26 0700 In: 360 [P.O.:360] Out: -  Intake/Output this shift:    Physical Exam: Gen. appearance-the patient is responsive and fairly comfortable  Eyes PERRLA  ENT negative  Neck Aspen collar in place  Lungs clear to P&A  Abdomen no palpable organs or masses  Skin warm and dry patient has healing laceration right side of scalp  Neurological cranial nerves intact no motor or sensory abnormalities  No results found for this basename: WBC, HGB, HCT, PLT,  in the last 72 hours BMET No results found for this basename: NA, K, CL, CO2, GLUCOSE, BUN, CREATININE, CALCIUM,  in the last 72 hours  Studies/Results: No results found.  Medications:  . aspirin EC  81 mg Oral Daily  . atorvastatin  10 mg Oral q1800  . diltiazem  120 mg Oral Daily  . docusate sodium  100 mg Oral QHS  . feeding supplement (ENSURE COMPLETE)  237 mL Oral BID BM  . feeding supplement (ENSURE)  1 Container Oral TID BM  . heparin  5,000 Units Subcutaneous 3 times per day  . levofloxacin  500 mg Oral Q48H  . sodium chloride  3 mL Intravenous Q12H  . sodium chloride  3 mL Intravenous Q12H  . thioridazine  10 mg Oral BID         Assessment/Plan: 1. Injury following fall with laceration and hematoma right side of scalp-continue wound care  2. Pneumonia lower lobe plan to continue the Levaquin  3. CK D stage III at baseline continue IV fluids  4. C2 cervical fracture-continue Aspen collar-arrange follow-up with Dr. Lovell SheehanJenkins in 1 month  5 prior CVA with organic brain syndrome-patient at baseline continue supportive care  6. Compression fracture vertebra of lumbar spine felt to be chronic  Patient is awaiting bed placement in nursing facility   LOS: 7 days   Channa Hazelett G 02/13/2014, 6:28 AM

## 2014-02-13 NOTE — Clinical Social Work Note (Signed)
CSW spoke with patient's granddaughter, Gershon CraneJoy Horlacher-Foster and advised that patient would likely be discharged today.  CS[facility] Mrs. Malen GauzeFoster indicated that she had spoken to her siblings and they had made the decision for patient to [facility]. Mrs. Malen GauzeFoster indicated that she had spoke to staff at [facility] and that the facility had agreed to purchase items to improve patient's safety. Mrs. Malen GauzeFoster indicated that she had given the facility the name and item numbers of the items to be purchased for patient.   She indicated that since speaking with the facility she and patient's family feel comfortable with [facility].    CSW spoke with April [facility], [name] related to patient's upcoming discharge.  Mrs. Everardo Allllison indicated that patient could return to the home upon discharge. She indicated that patient's granddaughter had requested that patient have bed alarms and that she would need an order for the alarms.  CSW advised that she would speak with Dr. Renard MatterMcInnis about the orders for the alarms  CSW spoke with Dr. Renard MatterMcInnis and advised that patient's family had chosen for [facility] to [facility].  Dr. Renard MatterMcInnis indicated that he would complete discharge orders for patient after lunch. CSW spoke with Dr. Renard MatterMcInnis about the bed alarms.  Dr. Renard MatterMcInnis advised that the facility would need to contact him upon patient's return to the facility and he would write the prescription for bed alarms.  CSW spoke with April [facility], [facility], and advised that she would need to contact Dr. Renard MatterMcInnis for the bed alarm prescription upon patient's arrival to the facility. Mrs. Everardo Allllison indicated that she would contact Dr. Renard MatterMcInnis related to this need.     Tretha SciaraHeather Nivia Gervase, [place] [phone]

## 2014-02-13 NOTE — Progress Notes (Signed)
Nutrition Follow-up   INTERVENTION: Recommend continue Ensure Complete po BID, each supplement provides 350 kcal and 13 grams of protein with pt discharge orders  Add Magic Cup with meals (290 kcal, 9 gr protein each)  Staff is assisting with feeding and encouraging meal/supplemnt intake  NUTRITION DIAGNOSIS: Inadequate oral intake  related to acute illness/ injury as evidenced by meal intake 0-25%.      Goal: Pt to meet >/= 90% of their estimated nutrition needs;not met   Monitor:  Skin assessments, Meals and supplements -percent po intake, labs and wt trends   80 y.o. female  Admitting Dx: Hypothermia  ASSESSMENT: Pt from [facility]. Her hx includes severe malnutrition, anemia related to chronic disease, CKD, stage III, hyperlipidemia, organic brain syndrome, compression fracture lumbar spine, history of CVA with multiple lacunar infarcts and remote left cerebral infarct. Kendra [name] continues to be poor 0-50% of most meals. She is being fed by staff. She plan's to d/c back to [facility] when cleared medically.    Labs reviewed: BUN, and glucose trending down.  Height: Ht Readings from Last 1 Encounters:  02/06/14 $RemoveB'5\' 4"'[id number]$  (1.626 m)    Weight: Wt Readings from Last 1 Encounters:  02/06/14 99 lb 3.3 oz (45 kg)    Ideal Body Weight: 120#  Wt Readings from Last 10 Encounters:  02/06/14 99 lb 3.3 oz (45 kg)  02/03/14 95 lb (43.092 kg)  08/29/13 95 lb 10.9 oz (43.4 kg)    Usual Body Weight: 95#  BMI:  Body mass index is 17.02 kg/(m^2). underweight  Estimated Nutritional Needs: Kcal: 2876-8115 Protein: 58-67 gr Fluid: 1.4-1.5 liters daily  Skin: laceration to right side of face- healing  Diet Order: Dysphagia 2 with thin liquids  EDUCATION NEEDS: -No education needs identified at this time   Intake/Output Summary (Last 24 hours) at 02/13/14 1615 Last data filed at 02/13/14 1220  Gross per 24 hour  Intake    180 ml  Output      0 ml  Net    180  ml    Last BM: 10/26  Labs:   Recent Labs Lab 02/07/14 0552 02/09/14 0546  NA 142 142  K 4.3 4.3  CL 108 108  CO2 21 22  BUN 25* 24*  CREATININE 1.20* 1.33*  CALCIUM 8.1* 8.1*  GLUCOSE 100* 77    CBG (last 3)  No results found for this basename: GLUCAP,  in the last 72 hours  Scheduled Meds: . aspirin EC  81 mg Oral Daily  . atorvastatin  10 mg Oral q1800  . diltiazem  120 mg Oral Daily  . docusate sodium  100 mg Oral QHS  . feeding supplement (ENSURE COMPLETE)  237 mL Oral BID BM  . feeding supplement (ENSURE)  1 Container Oral TID BM  . heparin  5,000 Units Subcutaneous 3 times per day  . levofloxacin  500 mg Oral Q48H  . sodium chloride  3 mL Intravenous Q12H  . sodium chloride  3 mL Intravenous Q12H  . thioridazine  10 mg Oral BID    Continuous Infusions:    Past Medical History  Diagnosis Date  . Organic brain syndrome   . Hard of hearing   . Chronic alcohol abuse   . Hyperlipidemia   . Fall   . CKD (chronic kidney disease), stage III   . CVA (cerebral infarction)   . Dementia   . Compression fx, lumbar spine     L1 and L2 2015  Past Surgical History  Procedure Laterality Date  . Chronic alcohol abuse    . Fracture surgery    . Femur fracture surgery      Colman Cater Kendra,RD,CSG,LDN Office: #[phone] Pager: [phone]

## 2014-02-13 NOTE — Plan of Care (Signed)
Problem: Phase III Progression Outcomes Goal: Voiding independently Outcome: Not Met (add Reason) Incontinent

## 2014-02-14 MED ORDER — ATORVASTATIN CALCIUM 10 MG PO TABS: 10.0000 mg | ORAL_TABLET | Freq: Every day | ORAL | Status: AC

## 2014-02-14 NOTE — Discharge Summary (Signed)
Physician Discharge Summary  Kendra ChaseMattie E [name] NWG:[id number] DOB: [birth year] DOA: 02/06/2014  PCP: Alice ReichertMCINNIS,Tahtiana Rozier G, MD  Admit date: 02/06/2014 Discharge date: 02/14/2014   Follow-up Information   Follow up with [facility]. (They will call before 1st visit)    Contact information:   [address] [place] [address] [phone]       Discharge Diagnoses:  1. Fall with head injury, laceration of scalp and scalp hematoma 2. Lower lobe pneumonia 3. C2 cervical vertebra fracture 4. Chronic kidney disease stage III 5. Organic brain syndrome 6. Old cerebral infarction Discharge Condition: Stable Disposition: Discharge to [facility]  Diet recommendation: 2000-calorie ADA diet  Filed Weights   02/06/14 0758 02/06/14 0800 02/06/14 1550  Weight: 43.092 kg (95 lb) 43.092 kg (95 lb) 45 kg (99 lb 3.3 oz)    History of present illness:  The patient was admitted through ED following a fall in nursing facility. She had the following problems noted in ED C2 fracture, hyperkalemia, acute on chronic kidney failure and hypothermia. The patient was given IV fluids and ED and warming blankets. The head wound was managed she became stable and was transferred to MedSurg unit. Hospital Course:  The case was discussed with Dr. Lovell SheehanJenkins neurosur[place] in [place] at the time of her admission. He recommended Aspen collar and also recommended that he see the patient in 1 month. The patient was maintained on medications listed below while in the hospital. Her head hematoma improved and lacerations healed slowly. She did have a short run of V. tach while in hospital setting requiring no treatment. It was obvious that she would need nursing home placement. Bed search was started by social services and it was determined that she could go to [facility]. The patient did have evidence of pneumonia by x-ray This gradually improved on IV  levofloxacin.  Discharge Instructions The patient will be sent to [name] and is to to continue medications listed below.    Medication List         aspirin 325 MG tablet  Take 325 mg by mouth daily.     atorvastatin 10 MG tablet  Commonly known as:  LIPITOR  Take 1 tablet (10 mg total) by mouth daily at 6 PM.     docusate sodium 100 MG capsule  Commonly known as:  COLACE  Take 100 mg by mouth at bedtime.     simvastatin 20 MG tablet  Commonly known as:  ZOCOR  Take 20 mg by mouth every evening.     thioridazine 10 MG tablet  Commonly known as:  MELLARIL  Take 10 mg by mouth 2 (two) times daily.       No Known Allergies  The results of significant diagnostics from this hospitalization (including imaging, microbiology, ancillary and laboratory) are listed below for reference.    Significant Diagnostic Studies: Dg Pelvis 1-2 Views  02/06/2014   CLINICAL DATA:  Status post fall last night. Patient found down. Initial encounter.  EXAM: PELVIS - 1-2 VIEW  COMPARISON:  Plain films of the hips 08/29/2013.  FINDINGS: No acute bony or joint abnormality is identified. Intra medullary nail fixing an old healed left femur fracture is unchanged in appearance.  IMPRESSION: No acute abnormality.   Electronically Signed   By: Drusilla Kannerhomas  Dalessio M.D.   On: 02/06/2014 09:32   Ct Head Wo Contrast  02/06/2014   CLINICAL DATA:  Fall during the night found on floor of nursing facility  on her right side; laceration to rt side of face and nose  EXAM: CT HEAD WITHOUT CONTRAST  CT MAXILLOFACIAL WITHOUT CONTRAST  CT CERVICAL SPINE WITHOUT CONTRAST  TECHNIQUE: Multidetector CT imaging of the head, cervical spine, and maxillofacial structures were performed using the standard protocol without intravenous contrast. Multiplanar CT image reconstructions of the cervical spine and maxillofacial structures were also generated.  COMPARISON:  Head CT 11/12/2013.  FINDINGS: CT HEAD FINDINGS  No  intracranial hemorrhage. No parenchymal contusion. No midline shift or mass effect. Basilar cisterns are patent. No skull base fracture. No fluid in the paranasal sinuses or mastoid air cells. Orbits are normal.  Remote infarction in the left cerebellum. Generalized cortical atrophy. This extensive periventricular and subcortical white matter hypodensities.  There is scalp hematoma midline over the right frontal bone. No associated skull fracture.  There is fluid in the right maxillary sinus.  CT MAXILLOFACIAL FINDINGS  Right frontal scalp hematoma. No evidence of orbital fracture. Zygomatic arches are intact. No maxillary bone fracture. Pterygoid plates are intact. Smaller fluid in the right maxillary sinus. Mandibular condyles are located. There is motion artifact which blurs the mandible. Repeat images performed through the mandible with persistent abnormality. There is step-off of the mandibular bodies bilaterally and more prominent on the left.  On the coronal images air are system and interbody difficult cell this is motion artifact or fracture.  CT CERVICAL SPINE FINDINGS  There is a fracture through the midbody of the dens with posterior displacement of the upper dens by 5 mm. There are nondisplaced vertical fractures through the left and right neural arch of C2 seen on image 16, series 16. No epidural hematoma. No significant prevertebral soft tissue swelling. The C3 through T1 vertebral bodies are normal without evidence of fracture or subluxation. No epidural paraspinal hematoma.  IMPRESSION: 1. Right frontal scalp hematoma. 2. No evidence of intracranial trauma. 3. Concern for mandibular rami fractures bilaterally and more prominent on the left. There is significant motion artifact through this region. Recommend clinical correlation. 4. Acute fracture of the C2 body of the dens as well as the neural arch of C2 on the left and right. Findings conveyed toIVA KNAPP on 02/06/2014  at09:48.   Electronically  Signed   By: Genevive Bi M.D.   On: 02/06/2014 09:48   Ct Cervical Spine Wo Contrast  02/06/2014   CLINICAL DATA:  Fall during the night found on floor of nursing facility on her right side; laceration to rt side of face and nose  EXAM: CT HEAD WITHOUT CONTRAST  CT MAXILLOFACIAL WITHOUT CONTRAST  CT CERVICAL SPINE WITHOUT CONTRAST  TECHNIQUE: Multidetector CT imaging of the head, cervical spine, and maxillofacial structures were performed using the standard protocol without intravenous contrast. Multiplanar CT image reconstructions of the cervical spine and maxillofacial structures were also generated.  COMPARISON:  Head CT 11/12/2013.  FINDINGS: CT HEAD FINDINGS  No intracranial hemorrhage. No parenchymal contusion. No midline shift or mass effect. Basilar cisterns are patent. No skull base fracture. No fluid in the paranasal sinuses or mastoid air cells. Orbits are normal.  Remote infarction in the left cerebellum. Generalized cortical atrophy. This extensive periventricular and subcortical white matter hypodensities.  There is scalp hematoma midline over the right frontal bone. No associated skull fracture.  There is fluid in the right maxillary sinus.  CT MAXILLOFACIAL FINDINGS  Right frontal scalp hematoma. No evidence of orbital fracture. Zygomatic arches are intact. No maxillary bone fracture. Pterygoid plates are intact. Smaller  fluid in the right maxillary sinus. Mandibular condyles are located. There is motion artifact which blurs the mandible. Repeat images performed through the mandible with persistent abnormality. There is step-off of the mandibular bodies bilaterally and more prominent on the left.  On the coronal images air are system and interbody difficult cell this is motion artifact or fracture.  CT CERVICAL SPINE FINDINGS  There is a fracture through the midbody of the dens with posterior displacement of the upper dens by 5 mm. There are nondisplaced vertical fractures through the left  and right neural arch of C2 seen on image 16, series 16. No epidural hematoma. No significant prevertebral soft tissue swelling. The C3 through T1 vertebral bodies are normal without evidence of fracture or subluxation. No epidural paraspinal hematoma.  IMPRESSION: 1. Right frontal scalp hematoma. 2. No evidence of intracranial trauma. 3. Concern for mandibular rami fractures bilaterally and more prominent on the left. There is significant motion artifact through this region. Recommend clinical correlation. 4. Acute fracture of the C2 body of the dens as well as the neural arch of C2 on the left and right. Findings conveyed toIVA KNAPP on 02/06/2014  at09:48.   Electronically Signed   By: Genevive Bi M.D.   On: 02/06/2014 09:48   Dg Chest Portable 1 View  02/06/2014   CLINICAL DATA:  Unwitnessed fall last night with patient found on floor this morning with head laceration; history of previous CVA, organic brain syndrome, and chronic alcohol abuse.  EXAM: PORTABLE CHEST - 1 VIEW  COMPARISON:  Limited views of the chest from any thoracic spine series of Aug 29, 2013  FINDINGS: The lungs are adequately inflated. There are coarse infrahilar lung markings on the left. The cardiac silhouette is top-normal in size. The pulmonary vascularity is not engorged. There is mild tortuosity of the descending thoracic aorta. There is mild soft tissue fullness in the upper right paratracheal region which may be accentuated by patient rotation. There is no pleural effusion. There is superior positioning of the right humeral head with respect to the glenoid which may reflect previous rotator cuff arthropathy.  IMPRESSION: 1. There are coarse left infrahilar lung markings which may reflect atelectasis or pneumonia. There is no evidence of CHF. 2. There are degenerative changes of the right shoulder with superior migration of the humeral head. This is only partially included in the field of view. 3. There is soft tissue fullness  in the right paratracheal region. This is of uncertain etiology and was not visible on the previous study which was markedly different in positioning. When the patient can tolerate the procedure, a well-positioned PA chest x-ray would be useful. Alternatively, CT scanning of the chest could be considered.   Electronically Signed   By: David  [place]   On: 02/06/2014 15:39   Dg Shoulder Left Port  02/06/2014   CLINICAL DATA:  Status post fall this morning. Left shoulder pain. Initial encounter.  EXAM: LEFT SHOULDER - 1 VIEW  COMPARISON:  None.  FINDINGS: The humeral head is high-riding consistent with chronic rotator cuff tear. The humeral head appears anteriorly subluxed on the Y-view. There is sclerosis of the articular surface the humeral head and adjacent coracoid process suggesting chronic change. No fracture is identified. Mild acromioclavicular degenerative change is noted.  IMPRESSION: Abnormal appearance of left shoulder most suggestive of chronic anterior subluxation of the humeral head and chronic rotator cuff tear.  Negative for fracture.   Electronically Signed   By: Drusilla Kanner  M.D.   On: 02/06/2014 12:48   Ct Maxillofacial Wo Cm  02/06/2014   CLINICAL DATA:  Fall during the night found on floor of nursing facility on her right side; laceration to rt side of face and nose  EXAM: CT HEAD WITHOUT CONTRAST  CT MAXILLOFACIAL WITHOUT CONTRAST  CT CERVICAL SPINE WITHOUT CONTRAST  TECHNIQUE: Multidetector CT imaging of the head, cervical spine, and maxillofacial structures were performed using the standard protocol without intravenous contrast. Multiplanar CT image reconstructions of the cervical spine and maxillofacial structures were also generated.  COMPARISON:  Head CT 11/12/2013.  FINDINGS: CT HEAD FINDINGS  No intracranial hemorrhage. No parenchymal contusion. No midline shift or mass effect. Basilar cisterns are patent. No skull base fracture. No fluid in the paranasal sinuses or mastoid air  cells. Orbits are normal.  Remote infarction in the left cerebellum. Generalized cortical atrophy. This extensive periventricular and subcortical white matter hypodensities.  There is scalp hematoma midline over the right frontal bone. No associated skull fracture.  There is fluid in the right maxillary sinus.  CT MAXILLOFACIAL FINDINGS  Right frontal scalp hematoma. No evidence of orbital fracture. Zygomatic arches are intact. No maxillary bone fracture. Pterygoid plates are intact. Smaller fluid in the right maxillary sinus. Mandibular condyles are located. There is motion artifact which blurs the mandible. Repeat images performed through the mandible with persistent abnormality. There is step-off of the mandibular bodies bilaterally and more prominent on the left.  On the coronal images air are system and interbody difficult cell this is motion artifact or fracture.  CT CERVICAL SPINE FINDINGS  There is a fracture through the midbody of the dens with posterior displacement of the upper dens by 5 mm. There are nondisplaced vertical fractures through the left and right neural arch of C2 seen on image 16, series 16. No epidural hematoma. No significant prevertebral soft tissue swelling. The C3 through T1 vertebral bodies are normal without evidence of fracture or subluxation. No epidural paraspinal hematoma.  IMPRESSION: 1. Right frontal scalp hematoma. 2. No evidence of intracranial trauma. 3. Concern for mandibular rami fractures bilaterally and more prominent on the left. There is significant motion artifact through this region. Recommend clinical correlation. 4. Acute fracture of the C2 body of the dens as well as the neural arch of C2 on the left and right. Findings conveyed toIVA KNAPP on 02/06/2014  at09:48.   Electronically Signed   By: Genevive BiStewart  Edmunds M.D.   On: 02/06/2014 09:48    Microbiology: Recent Results (from the past 240 hour(s))  MRSA PCR SCREENING     Status: None   Collection Time     02/06/14 10:00 PM      Result Value Ref Range Status   MRSA by PCR NEGATIVE  NEGATIVE Final   Comment:            The GeneXpert MRSA Assay (FDA     approved for NASAL specimens     only), is one component of a     comprehensive MRSA colonization     surveillance program. It is not     intended to diagnose MRSA     infection nor to guide or     monitor treatment for     MRSA infections.     Labs: Basic Metabolic Panel:  Recent Labs Lab 02/09/14 0546  NA 142  K 4.3  CL 108  CO2 22  GLUCOSE 77  BUN 24*  CREATININE 1.33*  CALCIUM 8.1*   Liver  Function Tests: No results found for this basename: AST, ALT, ALKPHOS, BILITOT, PROT, ALBUMIN,  in the last 168 hours No results found for this basename: LIPASE, AMYLASE,  in the last 168 hours No results found for this basename: AMMONIA,  in the last 168 hours CBC: No results found for this basename: WBC, NEUTROABS, HGB, HCT, MCV, PLT,  in the last 168 hours Cardiac Enzymes: No results found for this basename: CKTOTAL, CKMB, CKMBINDEX, TROPONINI,  in the last 168 hours BNP: BNP (last 3 results) No results found for this basename: PROBNP,  in the last 8760 hours CBG: No results found for this basename: GLUCAP,  in the last 168 hours  Principal Problem:   Hypothermia Active Problems:   Protein-calorie malnutrition, severe   C2 cervical fracture   Organic brain syndrome   Hyperlipidemia   CKD (chronic kidney disease), stage III   Hyperkalemia   Compression fx, lumbar spine   Anemia   Elevated CK   Time coordinating discharge: 30 minutes  Signed:  Butch Penny, MD 02/14/2014, 6:18 AM

## 2014-02-14 NOTE — Clinical Social Work Note (Signed)
Pt d/c today back to [facility]. April at facility aware and agreeable and will pick up pt. Pt's granddaughter, Joy notified by voicemail. D/C summary and FL2 faxed. April to get scripts at MD office.   Derenda FennelKara Donnika Kucher, [place] [phone]

## 2014-02-15 NOTE — Progress Notes (Signed)
UR chart review completed.  

## 2014-03-09 ENCOUNTER — Encounter (HOSPITAL_COMMUNITY): Payer: Self-pay

## 2014-03-09 ENCOUNTER — Observation Stay (HOSPITAL_COMMUNITY)
Admission: EM | Admit: 2014-03-09 | Discharge: 2014-03-11 | Disposition: A | Discharge status: Home / Self Care | Payer: Medicare Other | Attending: Family Medicine | Admitting: Family Medicine

## 2014-03-09 ENCOUNTER — Emergency Department (HOSPITAL_COMMUNITY): Payer: Medicare Other

## 2014-03-09 DIAGNOSIS — R030 Elevated blood-pressure reading, without diagnosis of hypertension: Secondary | ICD-10-CM

## 2014-03-09 DIAGNOSIS — Z9181 History of falling: Secondary | ICD-10-CM | POA: Insufficient documentation

## 2014-03-09 DIAGNOSIS — S12100D Unspecified displaced fracture of second cervical vertebra, subsequent encounter for fracture with routine healing: Secondary | ICD-10-CM | POA: Diagnosis not present

## 2014-03-09 DIAGNOSIS — F1010 Alcohol abuse, uncomplicated: Secondary | ICD-10-CM | POA: Insufficient documentation

## 2014-03-09 DIAGNOSIS — Z7982 Long term (current) use of aspirin: Secondary | ICD-10-CM | POA: Diagnosis not present

## 2014-03-09 DIAGNOSIS — E860 Dehydration: Principal | ICD-10-CM | POA: Insufficient documentation

## 2014-03-09 DIAGNOSIS — Z8781 Personal history of (healed) traumatic fracture: Secondary | ICD-10-CM | POA: Insufficient documentation

## 2014-03-09 DIAGNOSIS — H9190 Unspecified hearing loss, unspecified ear: Secondary | ICD-10-CM | POA: Insufficient documentation

## 2014-03-09 DIAGNOSIS — N183 Chronic kidney disease, stage 3 unspecified: Secondary | ICD-10-CM | POA: Diagnosis present

## 2014-03-09 DIAGNOSIS — IMO0001 Reserved for inherently not codable concepts without codable children: Secondary | ICD-10-CM

## 2014-03-09 DIAGNOSIS — F0390 Unspecified dementia without behavioral disturbance: Secondary | ICD-10-CM | POA: Diagnosis not present

## 2014-03-09 DIAGNOSIS — I639 Cerebral infarction, unspecified: Secondary | ICD-10-CM | POA: Insufficient documentation

## 2014-03-09 DIAGNOSIS — I129 Hypertensive chronic kidney disease with stage 1 through stage 4 chronic kidney disease, or unspecified chronic kidney disease: Secondary | ICD-10-CM | POA: Insufficient documentation

## 2014-03-09 DIAGNOSIS — Z79899 Other long term (current) drug therapy: Secondary | ICD-10-CM | POA: Diagnosis not present

## 2014-03-09 DIAGNOSIS — S12100A Unspecified displaced fracture of second cervical vertebra, initial encounter for closed fracture: Secondary | ICD-10-CM | POA: Diagnosis present

## 2014-03-09 DIAGNOSIS — R339 Retention of urine, unspecified: Secondary | ICD-10-CM | POA: Diagnosis present

## 2014-03-09 LAB — CBC WITH DIFFERENTIAL/PLATELET
BASOS ABS: 0 10*3/uL (ref 0.0–0.1)
Basophils Relative: 0 % (ref 0–1)
EOS PCT: 1 % (ref 0–5)
Eosinophils Absolute: 0 10*3/uL (ref 0.0–0.7)
HEMATOCRIT: 29 % — AB (ref 36.0–46.0)
Hemoglobin: 9.5 g/dL — ABNORMAL LOW (ref 12.0–15.0)
Lymphocytes Relative: 16 % (ref 12–46)
Lymphs Abs: 0.9 10*3/uL (ref 0.7–4.0)
MCH: 29.5 pg (ref 26.0–34.0)
MCHC: 32.8 g/dL (ref 30.0–36.0)
MCV: 90.1 fL (ref 78.0–100.0)
MONO ABS: 0.5 10*3/uL (ref 0.1–1.0)
Monocytes Relative: 8 % (ref 3–12)
Neutro Abs: 4.3 10*3/uL (ref 1.7–7.7)
Neutrophils Relative %: 75 % (ref 43–77)
Platelets: 157 10*3/uL (ref 150–400)
RBC: 3.22 MIL/uL — ABNORMAL LOW (ref 3.87–5.11)
RDW: 15.2 % (ref 11.5–15.5)
WBC: 5.7 10*3/uL (ref 4.0–10.5)

## 2014-03-09 LAB — COMPREHENSIVE METABOLIC PANEL
ALBUMIN: 2.9 g/dL — AB (ref 3.5–5.2)
ALK PHOS: 69 U/L (ref 39–117)
ALT: 8 U/L (ref 0–35)
AST: 17 U/L (ref 0–37)
Anion gap: 13 (ref 5–15)
BILIRUBIN TOTAL: 0.3 mg/dL (ref 0.3–1.2)
BUN: 28 mg/dL — AB (ref 6–23)
CHLORIDE: 107 meq/L (ref 96–112)
CO2: 20 mEq/L (ref 19–32)
Calcium: 8.6 mg/dL (ref 8.4–10.5)
Creatinine, Ser: 1.55 mg/dL — ABNORMAL HIGH (ref 0.50–1.10)
GFR calc Af Amer: 31 mL/min — ABNORMAL LOW (ref >90)
GFR calc non Af Amer: 27 mL/min — ABNORMAL LOW (ref >90)
Glucose, Bld: 82 mg/dL (ref 70–99)
POTASSIUM: 5.2 meq/L (ref 3.7–5.3)
Sodium: 140 mEq/L (ref 137–147)
Total Protein: 6.4 g/dL (ref 6.0–8.3)

## 2014-03-09 LAB — TROPONIN I: Troponin I: <0.3 ng/mL (ref <0.30)

## 2014-03-09 LAB — LACTIC ACID, PLASMA: Lactic Acid, Venous: 1.6 mmol/L (ref 0.5–2.2)

## 2014-03-09 MED ORDER — ONDANSETRON HCL 4 MG/2ML IJ SOLN: 4.0000 mg | Freq: Four times a day (QID) | INTRAMUSCULAR | Status: DC | PRN

## 2014-03-09 MED ORDER — ATORVASTATIN CALCIUM 10 MG PO TABS
10.0000 mg | ORAL_TABLET | Freq: Every day | ORAL | Status: DC
  Administered 2014-03-09 – 2014-03-10 (×2): 10 mg via ORAL
  Filled 2014-03-09 (×2): qty 1

## 2014-03-09 MED ORDER — SODIUM CHLORIDE 0.9 % IV SOLN
INTRAVENOUS | Status: DC
  Administered 2014-03-09: 12:00:00 via INTRAVENOUS

## 2014-03-09 MED ORDER — HYDRALAZINE HCL 20 MG/ML IJ SOLN
10.0000 mg | Freq: Four times a day (QID) | INTRAMUSCULAR | Status: DC | PRN
  Filled 2014-03-09 (×2): qty 1

## 2014-03-09 MED ORDER — ASPIRIN 325 MG PO TABS
325.0000 mg | ORAL_TABLET | Freq: Every day | ORAL | Status: DC
  Administered 2014-03-10 – 2014-03-11 (×2): 325 mg via ORAL
  Filled 2014-03-09 (×2): qty 1

## 2014-03-09 MED ORDER — SODIUM CHLORIDE 0.9 % IV SOLN: INTRAVENOUS | Status: DC

## 2014-03-09 MED ORDER — SODIUM CHLORIDE 0.9 % IV BOLUS (SEPSIS)
250.0000 mL | Freq: Once | INTRAVENOUS | Status: AC
  Administered 2014-03-09: 250 mL via INTRAVENOUS

## 2014-03-09 MED ORDER — SIMVASTATIN 20 MG PO TABS: 20.0000 mg | ORAL_TABLET | Freq: Every evening | ORAL | Status: DC

## 2014-03-09 MED ORDER — THIORIDAZINE HCL 10 MG PO TABS
10.0000 mg | ORAL_TABLET | Freq: Two times a day (BID) | ORAL | Status: DC
  Administered 2014-03-09 – 2014-03-11 (×4): 10 mg via ORAL
  Filled 2014-03-09 (×8): qty 1

## 2014-03-09 MED ORDER — ACETAMINOPHEN 325 MG PO TABS: 650.0000 mg | ORAL_TABLET | Freq: Four times a day (QID) | ORAL | Status: DC | PRN

## 2014-03-09 MED ORDER — DOCUSATE SODIUM 100 MG PO CAPS
100.0000 mg | ORAL_CAPSULE | Freq: Every day | ORAL | Status: DC
  Administered 2014-03-09 – 2014-03-10 (×2): 100 mg via ORAL
  Filled 2014-03-09 (×2): qty 1

## 2014-03-09 MED ORDER — SENNOSIDES-DOCUSATE SODIUM 8.6-50 MG PO TABS: 1.0000 | ORAL_TABLET | Freq: Every evening | ORAL | Status: DC | PRN

## 2014-03-09 MED ORDER — THIORIDAZINE HCL 10 MG PO TABS
ORAL_TABLET | ORAL | Status: AC
  Filled 2014-03-09: qty 1

## 2014-03-09 MED ORDER — HEPARIN SODIUM (PORCINE) 5000 UNIT/ML IJ SOLN
5000.0000 [IU] | Freq: Three times a day (TID) | INTRAMUSCULAR | Status: DC
  Administered 2014-03-09 – 2014-03-11 (×5): 5000 [IU] via SUBCUTANEOUS
  Filled 2014-03-09 (×4): qty 1

## 2014-03-09 MED ORDER — ACETAMINOPHEN 650 MG RE SUPP: 650.0000 mg | Freq: Four times a day (QID) | RECTAL | Status: DC | PRN

## 2014-03-09 MED ORDER — SODIUM CHLORIDE 0.9 % IV SOLN
INTRAVENOUS | Status: DC
  Administered 2014-03-09 – 2014-03-10 (×3): via INTRAVENOUS

## 2014-03-09 MED ORDER — ONDANSETRON HCL 4 MG PO TABS: 4.0000 mg | ORAL_TABLET | Freq: Four times a day (QID) | ORAL | Status: DC | PRN

## 2014-03-09 NOTE — ED Notes (Signed)
Attempted report x1. 

## 2014-03-09 NOTE — ED Provider Notes (Signed)
CSN: [id number]     Arrival date & time 03/09/14  1011 History   First MD Initiated Contact with Patient 03/09/14 1113     Chief Complaint  Patient presents with  . Urinary Retention      The history is provided by a caregiver. The history is limited by the condition of the patient (Hx dementia).  Pt was seen at 1115. Per caregiver's at group home: pt has not had BM or voided x3 days. Pt's last urine output 3 days ago was "dark" and "foul smelling." Pt also has had poor PO intake x3 days. Pt has significant hx of dementia.    Past Medical History  Diagnosis Date  . Organic brain syndrome   . Hard of hearing   . Chronic alcohol abuse   . Hyperlipidemia   . Fall   . CKD (chronic kidney disease), stage III   . CVA (cerebral infarction)   . Dementia   . Compression fx, lumbar spine     L1 and L2 2015   Past Surgical History  Procedure Laterality Date  . Chronic alcohol abuse    . Fracture surgery    . Femur fracture surgery      History  Substance Use Topics  . Smoking status: Never Smoker   . Smokeless tobacco: Not on file  . Alcohol Use: No    Review of Systems  Unable to perform ROS: Dementia    Allergies  Review of patient's allergies indicates no known allergies.  Home Medications   Prior to Admission medications   Medication Sig Start Date End Date Taking? Authorizing Provider  aspirin 325 MG tablet Take 325 mg by mouth daily.   Yes Historical Provider, MD  atorvastatin (LIPITOR) 10 MG tablet Take 1 tablet (10 mg total) by mouth daily at 6 PM. 02/14/14  Yes Angus Edilia BoG McInnis, MD  docusate sodium (COLACE) 100 MG capsule Take 100 mg by mouth at bedtime.   Yes Historical Provider, MD  simvastatin (ZOCOR) 20 MG tablet Take 20 mg by mouth every evening.   Yes Historical Provider, MD  thioridazine (MELLARIL) 10 MG tablet Take 10 mg by mouth 2 (two) times daily.   Yes Historical Provider, MD   BP 181/120 mmHg  Pulse 67  Temp(Src) 97.6 F (36.4 C) (Oral)  Resp 14   SpO2 100% Physical Exam  1120: Physical examination:  Nursing notes reviewed; Vital signs and O2 SAT reviewed;  Constitutional: Thin, frail. In no acute distress; Head:  Normocephalic, atraumatic; Eyes: EOMI, PERRL, No scleral icterus; ENMT: Mouth and pharynx normal, Mucous membranes dry; Neck: Aspen collar in place, trachea midline.; Cardiovascular: Regular rate and rhythm, No gallop; Respiratory: Breath sounds clear & equal bilaterally, No wheezes. Normal respiratory effort/excursion; Chest: Nontender, Movement normal; Abdomen: Soft, Nontender, Nondistended, Normal bowel sounds; Genitourinary: No CVA tenderness; Extremities: Pulses normal, No tenderness, No edema, No calf edema or asymmetry.; Neuro: Awake, alert, confused per hx dementia. No facial droop. Speech clear. Moves all extremities on stretcher spontaneously..; Skin: Color normal, Warm, Dry.   ED Course  Procedures     EKG Interpretation   Date/Time:  Thursday March 09 2014 11:50:11 EST Ventricular Rate:  70 PR Interval:  179 QRS Duration: 130 QT Interval:  467 QTC Calculation: 504 R Axis:   -79 Text Interpretation:  Sinus rhythm Atrial premature complex RBBB and LAFB  Artifact When compared with ECG of 02/03/2014 No significant change was  found Confirmed by [facility] Hospital[facility]CCMANUS  MD, Nicholos JohnsKATHLEEN [phone]([id number]) on 03/09/2014 12:11:43  PM      MDM  MDM Reviewed: previous chart, nursing note and vitals Reviewed previous: labs and ECG Interpretation: labs, x-ray and ECG      Results for orders placed or performed during the hospital encounter of 03/09/14  Comprehensive metabolic panel  Result Value Ref Range   Sodium 140 137 - 147 mEq/L   Potassium 5.2 3.7 - 5.3 mEq/L   Chloride 107 96 - 112 mEq/L   CO2 20 19 - 32 mEq/L   Glucose, Bld 82 70 - 99 mg/dL   BUN 28 (H) 6 - 23 mg/dL   Creatinine, Ser 1.611.55 (H) 0.50 - 1.10 mg/dL   Calcium 8.6 8.4 - 09.610.5 mg/dL   Total Protein 6.4 6.0 - 8.3 g/dL   Albumin 2.9 (L) 3.5 - 5.2 g/dL   AST 17  0 - 37 U/L   ALT 8 0 - 35 U/L   Alkaline Phosphatase 69 39 - 117 U/L   Total Bilirubin 0.3 0.3 - 1.2 mg/dL   GFR calc non Af Amer 27 (L) >90 mL/min   GFR calc Af Amer 31 (L) >90 mL/min   Anion gap 13 5 - 15  CBC with Differential  Result Value Ref Range   WBC 5.7 4.0 - 10.5 K/uL   RBC 3.22 (L) 3.87 - 5.11 MIL/uL   Hemoglobin 9.5 (L) 12.0 - 15.0 g/dL   HCT 04.529.0 (L) 40.936.0 - 81.146.0 %   MCV 90.1 78.0 - 100.0 fL   MCH 29.5 26.0 - 34.0 pg   MCHC 32.8 30.0 - 36.0 g/dL   RDW 91.415.2 78.211.5 - 95.615.5 %   Platelets 157 150 - 400 K/uL   Neutrophils Relative % 75 43 - 77 %   Neutro Abs 4.3 1.7 - 7.7 K/uL   Lymphocytes Relative 16 12 - 46 %   Lymphs Abs 0.9 0.7 - 4.0 K/uL   Monocytes Relative 8 3 - 12 %   Monocytes Absolute 0.5 0.1 - 1.0 K/uL   Eosinophils Relative 1 0 - 5 %   Eosinophils Absolute 0.0 0.0 - 0.7 K/uL   Basophils Relative 0 0 - 1 %   Basophils Absolute 0.0 0.0 - 0.1 K/uL  Troponin I  Result Value Ref Range   Troponin I <0.30 <0.30 ng/mL  Lactic acid, plasma  Result Value Ref Range   Lactic Acid, Venous 1.6 0.5 - 2.2 mmol/L   Dg Abd Acute W/chest 03/09/2014   CLINICAL DATA:  Urinary retention for 3 days.  EXAM: ACUTE ABDOMEN SERIES (ABDOMEN 2 VIEW & CHEST 1 VIEW)  COMPARISON:  Chest radiograph 02/06/2014. Lumbar spine radiographs 08/29/2013.  FINDINGS: The patient is rotated to the right, partially limiting evaluation. Cardiomediastinal silhouette is grossly unchanged allowing for rotation. Thoracic aortic calcification is noted. Hiatal hernia is noted. The lungs are hypoinflated with minimal bibasilar opacity, likely atelectasis. No definite pleural effusion or pneumothorax is identified.  No intraperitoneal free air is identified. There is a small to moderate amount of stool throughout the colon and rectum. Scattered small bowel gas is present, with minimal prominence of 1 or 2 loops of small bowel in the left upper abdomen but no definite bowel dilatation seen to suggest obstruction. The  bones are osteopenic. Left femoral intramedullary nail is partially visualized. Vascular calcification is noted.  IMPRESSION: 1. Minimal bibasilar atelectasis. 2. Nonobstructed bowel-gas pattern.   Electronically Signed   By: Sebastian AcheAllen  Grady   On: 03/09/2014 13:07    1330:  H/H per baseline, BUN/Cr mildly  elevated. Bladder scan with 20ml total. Will continue IVF. Dx and testing d/w pt and family.  Questions answered.  Verb understanding, agreeable to admit.   T/C to [facility]. Ardyth Harps, case discussed, including:  HPI, pertinent PM/SHx, VS/PE, dx testing, ED course and treatment:  Agreeable to admit, requests to write temporary orders, obtain observation medical bed to Dr. Renard Matter' service.   Samuel Jester, DO 03/11/14 1930

## 2014-03-09 NOTE — ED Notes (Signed)
Missed floor nurses call while with another pt; attempted report x2; nurse unavailable. Will attempt again.

## 2014-03-09 NOTE — ED Notes (Signed)
Group home staff reports pt has not voided or had bm in 3 days.

## 2014-03-09 NOTE — H&P (Addendum)
[facility]          History and Physical    PCP:   Lanette Hampshire, MD   Chief Complaint:  Decreased PO intake, "no urine in 3 days"  HPI: Patient is a 80 y/o woman who presents from SNF with the above complaints. She has a h/o dementia, organic brain syndrome, hyperlipidemia, recent fall with a C2 fracture wearing an ASPEN collar and CKD Stage III. Her facility brings her to the ED because of concerns of no urine output in 3 days, she has not been eating or drinking well for a few days. Bladder scan on arrival showed only 25 cc of urine in the bladder. She has been started on IVF and an overnight admission is requested for dehydration.  Allergies:  No Known Allergies    Past Medical History  Diagnosis Date  . Organic brain syndrome   . Hard of hearing   . Chronic alcohol abuse   . Hyperlipidemia   . Fall   . CKD (chronic kidney disease), stage III   . CVA (cerebral infarction)   . Dementia   . Compression fx, lumbar spine     L1 and L2 2015    Past Surgical History  Procedure Laterality Date  . Chronic alcohol abuse    . Fracture surgery    . Femur fracture surgery      Prior to Admission medications   Medication Sig Start Date End Date Taking? Authorizing Provider  aspirin 325 MG tablet Take 325 mg by mouth daily.   Yes Historical Provider, MD  atorvastatin (LIPITOR) 10 MG tablet Take 1 tablet (10 mg total) by mouth daily at 6 PM. 02/14/14  Yes Angus Ailene Ravel, MD  docusate sodium (COLACE) 100 MG capsule Take 100 mg by mouth at bedtime.   Yes Historical Provider, MD  simvastatin (ZOCOR) 20 MG tablet Take 20 mg by mouth every evening.   Yes Historical Provider, MD  thioridazine (MELLARIL) 10 MG tablet Take 10 mg by mouth 2 (two) times daily.   Yes Historical Provider, MD    Social History:  reports that she has never smoked. She does not have any smokeless tobacco history on file. She reports that she does not drink alcohol or use illicit  drugs.  No family history on file.  Review of Systems:  Unable to obtain given dementia  Physical Exam: Blood pressure 208/158, pulse 75, temperature 97.9 F (36.6 C), temperature source Oral, resp. rate 18, weight 45.7 kg (100 lb 12 oz), SpO2 100 %. Gen: awake, does not engage in conversation HEENT: [place], PERRL Neck: ASPEN collar on CV: RRR Lungs: CTA B Abd: S/NT/ND/+BS Ext: no C/C/E/+pulses Neuro: moves all 4 spontaneously  Labs on Admission:  Results for orders placed or performed during the hospital encounter of 03/09/14 (from the past 48 hour(s))  Comprehensive metabolic panel     Status: Abnormal   Collection Time: 03/09/14 11:38 AM  Result Value Ref Range   Sodium 140 137 - 147 mEq/L   Potassium 5.2 3.7 - 5.3 mEq/L   Chloride 107 96 - 112 mEq/L   CO2 20 19 - 32 mEq/L   Glucose, Bld 82 70 - 99 mg/dL   BUN 28 (H) 6 - 23 mg/dL   Creatinine, Ser 1.55 (H) 0.50 - 1.10 mg/dL   Calcium 8.6 8.4 - 10.5 mg/dL   Total Protein 6.4 6.0 - 8.3 g/dL  Albumin 2.9 (L) 3.5 - 5.2 g/dL   AST 17 0 - 37 U/L   ALT 8 0 - 35 U/L   Alkaline Phosphatase 69 39 - 117 U/L   Total Bilirubin 0.3 0.3 - 1.2 mg/dL   GFR calc non Af Amer 27 (L) >90 mL/min   GFR calc Af Amer 31 (L) >90 mL/min    Comment: (NOTE) The eGFR has been calculated using the CKD EPI equation. This calculation has not been validated in all clinical situations. eGFR's persistently <90 mL/min signify possible Chronic Kidney Disease.    Anion gap 13 5 - 15  CBC with Differential     Status: Abnormal   Collection Time: 03/09/14 11:38 AM  Result Value Ref Range   WBC 5.7 4.0 - 10.5 K/uL   RBC 3.22 (L) 3.87 - 5.11 MIL/uL   Hemoglobin 9.5 (L) 12.0 - 15.0 g/dL   HCT 29.0 (L) 36.0 - 46.0 %   MCV 90.1 78.0 - 100.0 fL   MCH 29.5 26.0 - 34.0 pg   MCHC 32.8 30.0 - 36.0 g/dL   RDW 15.2 11.5 - 15.5 %   Platelets 157 150 - 400 K/uL   Neutrophils Relative % 75 43 - 77 %   Neutro Abs 4.3 1.7 - 7.7 K/uL   Lymphocytes Relative 16 12 -  46 %   Lymphs Abs 0.9 0.7 - 4.0 K/uL   Monocytes Relative 8 3 - 12 %   Monocytes Absolute 0.5 0.1 - 1.0 K/uL   Eosinophils Relative 1 0 - 5 %   Eosinophils Absolute 0.0 0.0 - 0.7 K/uL   Basophils Relative 0 0 - 1 %   Basophils Absolute 0.0 0.0 - 0.1 K/uL  Troponin I     Status: None   Collection Time: 03/09/14 11:38 AM  Result Value Ref Range   Troponin I <0.30 <0.30 ng/mL    Comment:        Due to the release kinetics of cTnI, a negative result within the first hours of the onset of symptoms does not rule out myocardial infarction with certainty. If myocardial infarction is still suspected, repeat the test at appropriate intervals.   Lactic acid, plasma     Status: None   Collection Time: 03/09/14 11:39 AM  Result Value Ref Range   Lactic Acid, Venous 1.6 0.5 - 2.2 mmol/L    Radiological Exams on Admission: Dg Abd Acute W/chest  03/09/2014   CLINICAL DATA:  Urinary retention for 3 days.  EXAM: ACUTE ABDOMEN SERIES (ABDOMEN 2 VIEW & CHEST 1 VIEW)  COMPARISON:  Chest radiograph 02/06/2014. Lumbar spine radiographs 08/29/2013.  FINDINGS: The patient is rotated to the right, partially limiting evaluation. Cardiomediastinal silhouette is grossly unchanged allowing for rotation. Thoracic aortic calcification is noted. Hiatal hernia is noted. The lungs are hypoinflated with minimal bibasilar opacity, likely atelectasis. No definite pleural effusion or pneumothorax is identified.  No intraperitoneal free air is identified. There is a small to moderate amount of stool throughout the colon and rectum. Scattered small bowel gas is present, with minimal prominence of 1 or 2 loops of small bowel in the left upper abdomen but no definite bowel dilatation seen to suggest obstruction. The bones are osteopenic. Left femoral intramedullary nail is partially visualized. Vascular calcification is noted.  IMPRESSION: 1. Minimal bibasilar atelectasis. 2. Nonobstructed bowel-gas pattern.   Electronically  Signed   By: Logan Bores   On: 03/09/2014 13:07    Assessment/Plan Principal Problem:   Dehydration Active  Problems:   C2 cervical fracture   CKD (chronic kidney disease), stage III   Elevated BP    Dehydration -Admit overnight for IVF hydration. -Dr. Everette Rank to assume care in the am.  Elevated BP -No diagnosis of HTN. -BP is quite elevated. -Will give PRN hydralazine. -Will not make diagnosis of HTN acutely while in the hospital.  Time Spent on Admission: 65 minutes  [facility] Pager: [phone] 03/09/2014, 5:27 PM

## 2014-03-09 NOTE — ED Notes (Signed)
Attempted report x3; nurse with another pt and will call back.

## 2014-03-09 NOTE — ED Notes (Signed)
Tried x3 nurses to in-and-out cath with 2 sizes of catheters. Bladder scanner indicated that pt as 224 ml in bladder, post 250 ml bolus. Pt had 29 ml pre-bolus. Dr. Clarene DukeMcmanus notified.

## 2014-03-10 DIAGNOSIS — E860 Dehydration: Secondary | ICD-10-CM | POA: Diagnosis not present

## 2014-03-10 LAB — CBC
HCT: 28.9 % — ABNORMAL LOW (ref 36.0–46.0)
HEMOGLOBIN: 9.5 g/dL — AB (ref 12.0–15.0)
MCH: 29.5 pg (ref 26.0–34.0)
MCHC: 32.9 g/dL (ref 30.0–36.0)
MCV: 89.8 fL (ref 78.0–100.0)
Platelets: 148 10*3/uL — ABNORMAL LOW (ref 150–400)
RBC: 3.22 MIL/uL — ABNORMAL LOW (ref 3.87–5.11)
RDW: 15.2 % (ref 11.5–15.5)
WBC: 4.7 10*3/uL (ref 4.0–10.5)

## 2014-03-10 LAB — BASIC METABOLIC PANEL
Anion gap: 12 (ref 5–15)
BUN: 24 mg/dL — AB (ref 6–23)
CHLORIDE: 110 meq/L (ref 96–112)
CO2: 18 mEq/L — ABNORMAL LOW (ref 19–32)
CREATININE: 1.3 mg/dL — AB (ref 0.50–1.10)
Calcium: 8.3 mg/dL — ABNORMAL LOW (ref 8.4–10.5)
GFR calc Af Amer: 39 mL/min — ABNORMAL LOW (ref >90)
GFR calc non Af Amer: 34 mL/min — ABNORMAL LOW (ref >90)
Glucose, Bld: 69 mg/dL — ABNORMAL LOW (ref 70–99)
Potassium: 5 mEq/L (ref 3.7–5.3)
Sodium: 140 mEq/L (ref 137–147)

## 2014-03-10 MED ORDER — CLONIDINE HCL 0.1 MG PO TABS
0.1000 mg | ORAL_TABLET | Freq: Three times a day (TID) | ORAL | Status: DC
  Administered 2014-03-10 – 2014-03-11 (×3): 0.1 mg via ORAL
  Filled 2014-03-10 (×3): qty 1

## 2014-03-10 NOTE — Care Management Note (Addendum)
    Page 1 of 1   03/10/2014     3:44:42 PM CARE MANAGEMENT NOTE 03/10/2014  Patient:  Kendra [name],Kendra [name]   Account Number:  [id number]  Date Initiated:  03/10/2014  Documentation initiated by:  Kathyrn SheriffHILDRESS,JESSICA  Subjective/Objective Assessment:   [facility]     Action/Plan:   Plans to discharge back to ALF today. No CM needs identified at this time.   Anticipated DC Date:  03/10/2014   Anticipated DC Plan:  ASSISTED LIVING / REST HOME  In-house referral  Clinical Social Worker      DC Planning Services  CM consult      Choice offered to / List presented to:          [facility]  [facility]  [facility]      [facility]  [facility]   Status of service:  Completed, signed off Medicare Important Message given?   (If response is "NO", the following Medicare IM given date fields will be blank) Date Medicare IM given:   Medicare IM given by:   Date Additional Medicare IM given:   Additional Medicare IM given by:    Discharge Disposition:  ASSISTED LIVING  Per UR Regulation:    If discussed at Long Length of Stay Meetings, dates discussed:    Comments:  03/10/2014 1545 Kathyrn SheriffJessica Childress, RN, MSN, PCCN Pt is active with [facility]. Pt will resume services at discharge. RN made aware to call [facility] if pt is discharged over weekend to notify them of discharge.  03/10/2014 Kathyrn SheriffJessica Childress, RN, MSN, PCCN

## 2014-03-10 NOTE — Progress Notes (Signed)
Subjective: The patient appears to be alert without complaints. She does have dementia organic brain syndrome and recent fall with C2 fracture wearing aspirin, and CK D stage III. She was admitted with the problem of decreased urinary output and dehydration  Objective: Vital signs in last 24 hours: Temp:  [97.6 F (36.4 C)-97.9 F (36.6 C)] 97.6 F (36.4 C) (11/20 0449) Pulse Rate:  [67-77] 70 (11/20 0449) Resp:  [14-18] 18 (11/19 1900) BP: (141-219)/(71-158) 148/95 mmHg (11/20 0449) SpO2:  [96 %-100 %] 100 % (11/20 0449) Weight:  [45.7 kg (100 lb 12 oz)] 45.7 kg (100 lb 12 oz) (11/19 1627) Weight change:  Last BM Date:  (unknown)  Intake/Output from previous day: 11/19 0701 - 11/20 0700 In: 270 [P.O.:270] Out: -  Intake/Output this shift: Total I/O In: 270 [P.O.:270] Out: -   Physical Exam: Gen. appearance-patient appears to be alert  H EENT negative  Neck Aspen collar on  Lungs clear to P&A  Heart regular rhythm no murmurs  Abdomen no palpable organs or masses  Extremities free of edema   Recent Labs  03/09/14 1138  WBC 5.7  HGB 9.5*  HCT 29.0*  PLT 157   BMET  Recent Labs  03/09/14 1138  NA 140  K 5.2  CL 107  CO2 20  GLUCOSE 82  BUN 28*  CREATININE 1.55*  CALCIUM 8.6    Studies/Results: Dg Abd Acute W/chest  03/09/2014   CLINICAL DATA:  Urinary retention for 3 days.  EXAM: ACUTE ABDOMEN SERIES (ABDOMEN 2 VIEW & CHEST 1 VIEW)  COMPARISON:  Chest radiograph 02/06/2014. Lumbar spine radiographs 08/29/2013.  FINDINGS: The patient is rotated to the right, partially limiting evaluation. Cardiomediastinal silhouette is grossly unchanged allowing for rotation. Thoracic aortic calcification is noted. Hiatal hernia is noted. The lungs are hypoinflated with minimal bibasilar opacity, likely atelectasis. No definite pleural effusion or pneumothorax is identified.  No intraperitoneal free air is identified. There is a small to moderate amount of stool  throughout the colon and rectum. Scattered small bowel gas is present, with minimal prominence of 1 or 2 loops of small bowel in the left upper abdomen but no definite bowel dilatation seen to suggest obstruction. The bones are osteopenic. Left femoral intramedullary nail is partially visualized. Vascular calcification is noted.  IMPRESSION: 1. Minimal bibasilar atelectasis. 2. Nonobstructed bowel-gas pattern.   Electronically Signed   By: Sebastian AcheAllen  Grady   On: 03/09/2014 13:07    Medications:  . aspirin  325 mg Oral Daily  . atorvastatin  10 mg Oral q1800  . docusate sodium  100 mg Oral QHS  . heparin  5,000 Units Subcutaneous 3 times per day  . thioridazine  10 mg Oral BID    . sodium chloride 75 mL/hr at 03/09/14 2008     Assessment/Plan: 1. Dehydration-continue IV fluids  2. C2 cervical fracture-continue Aspen collar  3. CK D stage III-continue to monitor  4. Hypertension essential continue to monitor   LOS: 1 day   Marisella Puccio G 03/10/2014, 6:29 AM

## 2014-03-10 NOTE — Progress Notes (Signed)
Dr. Renard MatterMcInnis notified of patients  High BP and temp of 94.4 degrees. New orders received.

## 2014-03-10 NOTE — Progress Notes (Signed)
UR completed 

## 2014-03-10 NOTE — Progress Notes (Signed)
Order from Dr. Renard MatterMcinnis to insert foley to monitor urine output received, but 2 nurses unable to insert foley. Dr. Renard MatterMcInnis notified that we were unable to insert foley and that we are meeting some type of resistance. Patient is putting urine out incontinent. Dr. Renard MatterMcInnis aware. No new orders at this time.

## 2014-03-10 NOTE — Clinical Social Work Psychosocial (Signed)
Clinical Social Work Department BRIEF PSYCHOSOCIAL ASSESSMENT 03/10/2014  Patient:  Kendra [name],Kendra [name]     Account Number:  [id number]     Admit date:  03/09/2014  Clinical Social Worker:  Nancie NeasSTULTZ,Sayge Salvato, LCSW  Date/Time:  03/10/2014 12:08 PM  Referred by:  CSW  Date Referred:  03/10/2014 Referred for  ALF Placement   Other Referral:   Interview type:  Family Other interview type:   granddaughter- Joy    PSYCHOSOCIAL DATA Living Status:  FACILITY Admitted from facility:   Level of care:  Family Care Home Primary support name:  Joy Primary support relationship to patient:  FAMILY Degree of support available:   Joy lives out of state    CURRENT CONCERNS Current Concerns  Post-Acute Placement   Other Concerns:    SOCIAL WORK ASSESSMENT / PLAN CSW spoke with pt's granddaughter, Joy on phone as pt sleeping at time of visit. Pt d/c at the end of last month from hospital back [facility]. Pt has been a resident there since the 1980s and they consider her to be family. Pt does not have a guardian and Ander SladeJoy is next of kin. [facility] has been doing well since d/c. She states that PCP requested a urine sample, but pt had not urinated in a couple days and had limited intake. Pt in observation due to dehydration. CSW updated April and Joy on pt. Per MD, pt will d/c in AM. Both are agreeable to plan to return to [facility] and April states she will pick up pt. She has been receiving home health PT with [facility]. Staff have also been assisting pt with ambulating with her walker. Pt prefers to sit on the couch at facility most of the day. Joy was appreciative of phone call and information as she is a travel Charity fundraiserN and [place] currently in [place], but wants to keep up with as much as she can. She said that her brother visited pt a few weeks ago, but they all live out of state.   Assessment/plan status:  Psychosocial Support/Ongoing Assessment of Needs Other assessment/  plan:   Information/referral to community resources:   [facility]    PATIENT'S/FAMILY'S RESPONSE TO PLAN OF CARE: Pt unable to participate in assessment. Facility and granddaughter agreeable to d/c back to [facility], probably tomorrow.       Derenda FennelKara Jacek Colson, [place]CS[place] [phone]

## 2014-03-11 MED ORDER — CLONIDINE HCL 0.1 MG PO TABS: 0.1000 mg | ORAL_TABLET | Freq: Three times a day (TID) | ORAL | Status: AC

## 2014-03-11 NOTE — Progress Notes (Signed)
SW [facility] FL[facility].  FL2 updated and faxed to Chales AbrahamsMary Ann at [phone]. Pt to be discharged back to ALF.  Derrell Lollingoris Sharelle Burditt, MSW  Social Worker [phone]

## 2014-03-11 NOTE — Progress Notes (Signed)
Utilization Review completed.  

## 2014-03-11 NOTE — Progress Notes (Signed)
[name] called and informed of patient's discharge.Patient taken off of warming blanket this am and maintained a temp of 98.3. Patient has a discharge packet at the desk including a discharge avs.

## 2014-03-11 NOTE — Discharge Summary (Signed)
Physician Discharge Summary  Kendra ChaseMattie E [name] ZOX:[id number] DOB: 07-Jan-1916 DOA: 03/09/2014  PCP: Alice ReichertMCINNIS,Asa Baudoin G, MD  Admit date: 03/09/2014 Discharge date: 03/11/2014     Discharge Diagnoses:  1. Dehydration 2. CK D stage II 3. C2 cervical fracture 4. Essential hypertension 5.   Discharge Condition: Stable Disposition: Patient to be returned to rest home Diet recommendation: 2 g sodium diet  Filed Weights   03/09/14 1627  Weight: 45.7 kg (100 lb 12 oz)    History of present illness:  The patient resides in assisted living facility she was brought to the hospital because of decreased by mouth intake and decrease urinary output for 2 days. Bladder scan on arrival showed only 25 mL of urine in bladder. She is started on intravenous fluids and ED and subsequently was admitted. The patient previously had a fall and cervical fracture C2. She is wearing Aspen collar Hospital Course: The patient was admitted to MedSurg floor on admission and was continued on intravenous fluids. She was also noted to have elevated blood pressure and was started on clonidine 0.1 mg every 8 hours. While on intravenous fluids of patient's urinary output increased. She continued on medications listed below. Her hydration continued, elevated blood pressure improved, and it was felt she could be sent back to assisted living.    Discharge Instructions The patient is to return to assisted living facility-appointment with primary care physician for follow-up care should be arranged    Medication List    STOP taking these medications        simvastatin 20 MG tablet  Commonly known as:  ZOCOR      TAKE these medications        aspirin 325 MG tablet  Take 325 mg by mouth daily.     atorvastatin 10 MG tablet  Commonly known as:  LIPITOR  Take 1 tablet (10 mg total) by mouth daily at 6 PM.     cloNIDine 0.1 MG tablet  Commonly known as:  CATAPRES  Take 1 tablet (0.1 mg total) by mouth 3 (three)  times daily.     docusate sodium 100 MG capsule  Commonly known as:  COLACE  Take 100 mg by mouth at bedtime.     thioridazine 10 MG tablet  Commonly known as:  MELLARIL  Take 10 mg by mouth 2 (two) times daily.       No Known Allergies  The results of significant diagnostics from this hospitalization (including imaging, microbiology, ancillary and laboratory) are listed below for reference.    Significant Diagnostic Studies: Dg Abd Acute W/chest  03/09/2014   CLINICAL DATA:  Urinary retention for 3 days.  EXAM: ACUTE ABDOMEN SERIES (ABDOMEN 2 VIEW & CHEST 1 VIEW)  COMPARISON:  Chest radiograph 02/06/2014. Lumbar spine radiographs 08/29/2013.  FINDINGS: The patient is rotated to the right, partially limiting evaluation. Cardiomediastinal silhouette is grossly unchanged allowing for rotation. Thoracic aortic calcification is noted. Hiatal hernia is noted. The lungs are hypoinflated with minimal bibasilar opacity, likely atelectasis. No definite pleural effusion or pneumothorax is identified.  No intraperitoneal free air is identified. There is a small to moderate amount of stool throughout the colon and rectum. Scattered small bowel gas is present, with minimal prominence of 1 or 2 loops of small bowel in the left upper abdomen but no definite bowel dilatation seen to suggest obstruction. The bones are osteopenic. Left femoral intramedullary nail is partially visualized. Vascular calcification is noted.  IMPRESSION: 1. Minimal bibasilar atelectasis. 2. Nonobstructed bowel-gas  pattern.   Electronically Signed   By: Sebastian AcheAllen  Grady   On: 03/09/2014 13:07    Microbiology: No results found for this or any previous visit (from the past 240 hour(s)).   Labs: Basic Metabolic Panel:  Recent Labs Lab 03/09/14 1138 03/10/14 0538  NA 140 140  K 5.2 5.0  CL 107 110  CO2 20 18*  GLUCOSE 82 69*  BUN 28* 24*  CREATININE 1.55* 1.30*  CALCIUM 8.6 8.3*   Liver Function Tests:  Recent Labs Lab  03/09/14 1138  AST 17  ALT 8  ALKPHOS 69  BILITOT 0.3  PROT 6.4  ALBUMIN 2.9*   No results for input(s): LIPASE, AMYLASE in the last 168 hours. No results for input(s): AMMONIA in the last 168 hours. CBC:  Recent Labs Lab 03/09/14 1138 03/10/14 0538  WBC 5.7 4.7  NEUTROABS 4.3  --   HGB 9.5* 9.5*  HCT 29.0* 28.9*  MCV 90.1 89.8  PLT 157 148*   Cardiac Enzymes:  Recent Labs Lab 03/09/14 1138  TROPONINI <0.30   BNP: BNP (last 3 results) No results for input(s): PROBNP in the last 8760 hours. CBG: No results for input(s): GLUCAP in the last 168 hours.  Principal Problem:   Dehydration Active Problems:   C2 cervical fracture   CKD (chronic kidney disease), stage III   Elevated BP   Time coordinating discharge: 30 minutes  Signed:  Butch PennyAngus Johnanthony Wilden, MD 03/11/2014, 8:57 AM

## 2014-03-11 NOTE — Plan of Care (Signed)
Problem: Phase I Progression Outcomes Goal: Pain controlled with appropriate interventions Outcome: Progressing Goal: OOB as tolerated unless otherwise ordered Outcome: Progressing     

## 2014-03-14 ENCOUNTER — Encounter (HOSPITAL_COMMUNITY): Payer: Self-pay | Admitting: Emergency Medicine

## 2014-03-14 ENCOUNTER — Emergency Department (HOSPITAL_COMMUNITY): Payer: Medicare Other

## 2014-03-14 ENCOUNTER — Inpatient Hospital Stay (HOSPITAL_COMMUNITY)
Admission: EM | Admit: 2014-03-14 | Discharge: 2014-03-18 | DRG: 535 | Disposition: A | Discharge status: Skilled Nursing Facility | Payer: Medicare Other | Attending: Family Medicine | Admitting: Family Medicine

## 2014-03-14 DIAGNOSIS — S72001A Fracture of unspecified part of neck of right femur, initial encounter for closed fracture: Principal | ICD-10-CM | POA: Diagnosis present

## 2014-03-14 DIAGNOSIS — N183 Chronic kidney disease, stage 3 unspecified: Secondary | ICD-10-CM | POA: Diagnosis present

## 2014-03-14 DIAGNOSIS — Y92122 Bedroom in nursing home as the place of occurrence of the external cause: Secondary | ICD-10-CM

## 2014-03-14 DIAGNOSIS — T1490XA Injury, unspecified, initial encounter: Secondary | ICD-10-CM

## 2014-03-14 DIAGNOSIS — S72009A Fracture of unspecified part of neck of unspecified femur, initial encounter for closed fracture: Secondary | ICD-10-CM | POA: Diagnosis present

## 2014-03-14 DIAGNOSIS — T68XXXA Hypothermia, initial encounter: Secondary | ICD-10-CM | POA: Diagnosis present

## 2014-03-14 DIAGNOSIS — Z681 Body mass index (BMI) 19 or less, adult: Secondary | ICD-10-CM | POA: Diagnosis not present

## 2014-03-14 DIAGNOSIS — F09 Unspecified mental disorder due to known physiological condition: Secondary | ICD-10-CM | POA: Diagnosis present

## 2014-03-14 DIAGNOSIS — D649 Anemia, unspecified: Secondary | ICD-10-CM | POA: Diagnosis present

## 2014-03-14 DIAGNOSIS — S12100D Unspecified displaced fracture of second cervical vertebra, subsequent encounter for fracture with routine healing: Secondary | ICD-10-CM | POA: Diagnosis not present

## 2014-03-14 DIAGNOSIS — R680 Hypothermia, not associated with low environmental temperature: Secondary | ICD-10-CM | POA: Diagnosis present

## 2014-03-14 DIAGNOSIS — E860 Dehydration: Secondary | ICD-10-CM | POA: Diagnosis present

## 2014-03-14 DIAGNOSIS — F0390 Unspecified dementia without behavioral disturbance: Secondary | ICD-10-CM | POA: Diagnosis present

## 2014-03-14 DIAGNOSIS — W06XXXA Fall from bed, initial encounter: Secondary | ICD-10-CM | POA: Diagnosis present

## 2014-03-14 DIAGNOSIS — Z8673 Personal history of transient ischemic attack (TIA), and cerebral infarction without residual deficits: Secondary | ICD-10-CM | POA: Diagnosis not present

## 2014-03-14 DIAGNOSIS — M25551 Pain in right hip: Secondary | ICD-10-CM | POA: Diagnosis present

## 2014-03-14 DIAGNOSIS — E785 Hyperlipidemia, unspecified: Secondary | ICD-10-CM | POA: Diagnosis present

## 2014-03-14 DIAGNOSIS — E43 Unspecified severe protein-calorie malnutrition: Secondary | ICD-10-CM | POA: Diagnosis present

## 2014-03-14 DIAGNOSIS — H9190 Unspecified hearing loss, unspecified ear: Secondary | ICD-10-CM | POA: Diagnosis present

## 2014-03-14 DIAGNOSIS — Z7982 Long term (current) use of aspirin: Secondary | ICD-10-CM | POA: Diagnosis not present

## 2014-03-14 DIAGNOSIS — R531 Weakness: Secondary | ICD-10-CM

## 2014-03-14 LAB — CBC WITH DIFFERENTIAL/PLATELET
Basophils Absolute: 0 10*3/uL (ref 0.0–0.1)
Basophils Relative: 0 % (ref 0–1)
Eosinophils Absolute: 0 10*3/uL (ref 0.0–0.7)
Eosinophils Relative: 0 % (ref 0–5)
HCT: 21.4 % — ABNORMAL LOW (ref 36.0–46.0)
HEMOGLOBIN: 7.2 g/dL — AB (ref 12.0–15.0)
LYMPHS ABS: 0.5 10*3/uL — AB (ref 0.7–4.0)
LYMPHS PCT: 12 % (ref 12–46)
MCH: 29.8 pg (ref 26.0–34.0)
MCHC: 33.6 g/dL (ref 30.0–36.0)
MCV: 88.4 fL (ref 78.0–100.0)
MONOS PCT: 9 % (ref 3–12)
Monocytes Absolute: 0.4 10*3/uL (ref 0.1–1.0)
NEUTROS ABS: 3.5 10*3/uL (ref 1.7–7.7)
NEUTROS PCT: 79 % — AB (ref 43–77)
PLATELETS: 104 10*3/uL — AB (ref 150–400)
RBC: 2.42 MIL/uL — AB (ref 3.87–5.11)
RDW: 15.1 % (ref 11.5–15.5)
WBC: 4.4 10*3/uL (ref 4.0–10.5)

## 2014-03-14 LAB — COMPREHENSIVE METABOLIC PANEL
ALK PHOS: 61 U/L (ref 39–117)
ALT: 8 U/L (ref 0–35)
AST: 14 U/L (ref 0–37)
Albumin: 2.6 g/dL — ABNORMAL LOW (ref 3.5–5.2)
Anion gap: 12 (ref 5–15)
BUN: 30 mg/dL — AB (ref 6–23)
CO2: 19 meq/L (ref 19–32)
Calcium: 8.1 mg/dL — ABNORMAL LOW (ref 8.4–10.5)
Chloride: 109 mEq/L (ref 96–112)
Creatinine, Ser: 1.3 mg/dL — ABNORMAL HIGH (ref 0.50–1.10)
GFR calc Af Amer: 39 mL/min — ABNORMAL LOW (ref >90)
GFR calc non Af Amer: 34 mL/min — ABNORMAL LOW (ref >90)
Glucose, Bld: 116 mg/dL — ABNORMAL HIGH (ref 70–99)
POTASSIUM: 4.9 meq/L (ref 3.7–5.3)
SODIUM: 140 meq/L (ref 137–147)
TOTAL PROTEIN: 5.7 g/dL — AB (ref 6.0–8.3)
Total Bilirubin: 0.2 mg/dL — ABNORMAL LOW (ref 0.3–1.2)

## 2014-03-14 LAB — TROPONIN I
Troponin I: <0.3 ng/mL (ref <0.30)
Troponin I: <0.3 ng/mL (ref <0.30)

## 2014-03-14 LAB — URINALYSIS, ROUTINE W REFLEX MICROSCOPIC
Bilirubin Urine: NEGATIVE
Glucose, UA: NEGATIVE mg/dL
KETONES UR: NEGATIVE mg/dL
NITRITE: NEGATIVE
Urobilinogen, UA: 1 mg/dL (ref 0.0–1.0)
pH: 5 (ref 5.0–8.0)

## 2014-03-14 LAB — URINE MICROSCOPIC-ADD ON

## 2014-03-14 LAB — RETICULOCYTES
RBC.: 2.42 MIL/uL — AB (ref 3.87–5.11)
RETIC COUNT ABSOLUTE: 55.7 10*3/uL (ref 19.0–186.0)
RETIC CT PCT: 2.3 % (ref 0.4–3.1)

## 2014-03-14 MED ORDER — BISACODYL 10 MG RE SUPP: 10.0000 mg | Freq: Every day | RECTAL | Status: DC | PRN

## 2014-03-14 MED ORDER — SENNOSIDES-DOCUSATE SODIUM 8.6-50 MG PO TABS: 1.0000 | ORAL_TABLET | Freq: Every evening | ORAL | Status: DC | PRN

## 2014-03-14 MED ORDER — SODIUM CHLORIDE 0.9 % IV BOLUS (SEPSIS)
500.0000 mL | Freq: Once | INTRAVENOUS | Status: AC
  Administered 2014-03-14: 500 mL via INTRAVENOUS

## 2014-03-14 MED ORDER — ENSURE COMPLETE PO LIQD: 237.0000 mL | Freq: Every day | ORAL | Status: DC | PRN

## 2014-03-14 MED ORDER — ONDANSETRON HCL 4 MG/2ML IJ SOLN: 4.0000 mg | Freq: Four times a day (QID) | INTRAMUSCULAR | Status: DC | PRN

## 2014-03-14 MED ORDER — ACETAMINOPHEN 325 MG PO TABS: 650.0000 mg | ORAL_TABLET | Freq: Four times a day (QID) | ORAL | Status: DC | PRN

## 2014-03-14 MED ORDER — HYDROCODONE-ACETAMINOPHEN 5-325 MG PO TABS
1.0000 | ORAL_TABLET | ORAL | Status: DC | PRN
  Administered 2014-03-17: 1 via ORAL
  Filled 2014-03-14: qty 1

## 2014-03-14 MED ORDER — DOCUSATE SODIUM 100 MG PO CAPS
100.0000 mg | ORAL_CAPSULE | Freq: Every day | ORAL | Status: DC
  Administered 2014-03-15 – 2014-03-17 (×3): 100 mg via ORAL
  Filled 2014-03-14 (×4): qty 1

## 2014-03-14 MED ORDER — ACETAMINOPHEN 650 MG RE SUPP: 650.0000 mg | Freq: Four times a day (QID) | RECTAL | Status: DC | PRN

## 2014-03-14 MED ORDER — CLONIDINE HCL 0.1 MG PO TABS
0.1000 mg | ORAL_TABLET | Freq: Three times a day (TID) | ORAL | Status: DC
  Administered 2014-03-15 – 2014-03-18 (×9): 0.1 mg via ORAL
  Filled 2014-03-14 (×11): qty 1

## 2014-03-14 MED ORDER — THIORIDAZINE HCL 10 MG PO TABS
10.0000 mg | ORAL_TABLET | Freq: Two times a day (BID) | ORAL | Status: DC
  Administered 2014-03-15 – 2014-03-18 (×6): 10 mg via ORAL
  Filled 2014-03-14 (×12): qty 1

## 2014-03-14 MED ORDER — ONDANSETRON HCL 4 MG PO TABS: 4.0000 mg | ORAL_TABLET | Freq: Four times a day (QID) | ORAL | Status: DC | PRN

## 2014-03-14 MED ORDER — SODIUM CHLORIDE 0.9 % IV SOLN
INTRAVENOUS | Status: DC
  Administered 2014-03-14 – 2014-03-17 (×6): via INTRAVENOUS

## 2014-03-14 MED ORDER — MORPHINE SULFATE 2 MG/ML IJ SOLN: 1.0000 mg | INTRAMUSCULAR | Status: DC | PRN

## 2014-03-14 MED ORDER — ALUM & MAG HYDROXIDE-SIMETH 200-200-20 MG/5ML PO SUSP: 30.0000 mL | Freq: Four times a day (QID) | ORAL | Status: DC | PRN

## 2014-03-14 MED ORDER — SIMVASTATIN 20 MG PO TABS
20.0000 mg | ORAL_TABLET | Freq: Every evening | ORAL | Status: DC
  Administered 2014-03-15 – 2014-03-17 (×3): 20 mg via ORAL
  Filled 2014-03-14 (×4): qty 1

## 2014-03-14 NOTE — Progress Notes (Signed)
Patient unable to take clonidine and Zocor due to lethargic. Patient also refusing to open mouth. Dr. Ardyth HarpsHernandez notified.

## 2014-03-14 NOTE — H&P (Signed)
[facility] History and Physical  Kendra ChaseMattie E [name] GMW:[id number]:[id number] DOB: [birth year] DOA: 03/14/2014  Referring physician:  PCP: Alice ReichertMCINNIS,ANGUS G, MD   Chief Complaint: fall  HPI: Kendra [name] is a 80 y.o. female presents to ED from SNF with CC fall. Initial work up revealed right hip fracture and hypothermia and anemia. Facility staff report patient attempted to get up out of bed alone and fell. She requires assistance with ambulation. Immediately complained of right leg pain. No hx fever chill, nausea/vomiting, diarrhea. No complaints of chest pain, headache dizziness or syncope.  In ED hemodynamically stable but rectal temp 92.2, not hypoxic. She is given IV fluids and warming blanket in ED.    Review of Systems:  10 point review of systems completed with the facility staff and all systems are negative except as indicated in the history of present illness Past Medical History  Diagnosis Date  . Organic brain syndrome   . Hard of hearing   . Chronic alcohol abuse   . Hyperlipidemia   . Fall   . CKD (chronic kidney disease), stage III   . CVA (cerebral infarction)   . Dementia   . Compression fx, lumbar spine     L1 and L2 2015   Past Surgical History  Procedure Laterality Date  . Chronic alcohol abuse    . Fracture surgery    . Femur fracture surgery     Social History:  reports that she has never smoked. She has never used smokeless tobacco. She reports that she does not drink alcohol or use illicit drugs. She is pleasantly confused very hard of hearing lives in assisted living facility No Known Allergies  History reviewed. No pertinent family history. family medical history is reviewed noncontributory to the admission of this elderly lady  Prior to Admission medications   Medication Sig Start Date End Date Taking? Authorizing Provider  aspirin 325 MG tablet Take 325 mg by mouth daily.   Yes Historical Provider, MD  cloNIDine (CATAPRES) 0.1 MG tablet Take  1 tablet (0.1 mg total) by mouth 3 (three) times daily. 03/11/14  Yes Angus Edilia BoG McInnis, MD  docusate sodium (COLACE) 100 MG capsule Take 100 mg by mouth at bedtime.   Yes Historical Provider, MD  feeding supplement (ENSURE IMMUNE HEALTH) LIQD Take 237 mLs by mouth daily as needed (meal replacement).   Yes Historical Provider, MD  simvastatin (ZOCOR) 20 MG tablet Take 20 mg by mouth every evening.   Yes Historical Provider, MD  thioridazine (MELLARIL) 10 MG tablet Take 10 mg by mouth 2 (two) times daily.   Yes Historical Provider, MD  atorvastatin (LIPITOR) 10 MG tablet Take 1 tablet (10 mg total) by mouth daily at 6 PM. Patient not taking: Reported on 03/14/2014 02/14/14   Alice ReichertAngus G McInnis, MD   Physical Exam: Filed Vitals:   03/14/14 1005 03/14/14 1006 03/14/14 1300 03/14/14 1430  BP: 161/96  163/83 130/71  Pulse: 54   54  Temp:  92.2 F (33.4 C) 90.8 F (32.7 C) 91.7 F (33.2 C)  TempSrc:  Rectal Core (Comment)   Resp: 18  22 12   Height: 5' (1.524 m)     Weight: 45.36 kg (100 lb)     SpO2: 100%   99%    Wt Readings from Last 3 Encounters:  03/14/14 45.36 kg (100 lb)  03/09/14 45.7 kg (100 lb 12 oz)  02/06/14 45 kg (99 lb 3.3 oz)    General:  Somewhat frail Lethargic Eyes: PERRL,  normal lids, irises & conjunctiva ENT: grossly normal hearing, smell membranes of her mouth are pink but dry she is very hard of hearing Neck: no LAD, masses or thyromegaly Cardiovascular: RRR, no LE edema Respiratory: somewhat shallow. BS clear no wheeze Abdomen: soft, ntnd non-distended  Skin: no rash or induration seen on limited exam Musculoskeletal: right leg shorter and internally rotated.  PPP Psychiatric:  Neurologic: somewhat lethargic very HOH unable to follow commands          Labs on Admission:  Basic Metabolic Panel:  Recent Labs Lab 03/09/14 1138 03/10/14 0538 03/14/14 1050  NA 140 140 140  K 5.2 5.0 4.9  CL 107 110 109  CO2 20 18* 19  GLUCOSE 82 69* 116*  BUN 28* 24*  30*  CREATININE 1.55* 1.30* 1.30*  CALCIUM 8.6 8.3* 8.1*   Liver Function Tests:  Recent Labs Lab 03/09/14 1138 03/14/14 1050  AST 17 14  ALT 8 8  ALKPHOS 69 61  BILITOT 0.3 0.2*  PROT 6.4 5.7*  ALBUMIN 2.9* 2.6*   No results for input(s): LIPASE, AMYLASE in the last 168 hours. No results for input(s): AMMONIA in the last 168 hours. CBC:  Recent Labs Lab 03/09/14 1138 03/10/14 0538 03/14/14 1050  WBC 5.7 4.7 4.4  NEUTROABS 4.3  --  3.5  HGB 9.5* 9.5* 7.2*  HCT 29.0* 28.9* 21.4*  MCV 90.1 89.8 88.4  PLT 157 148* 104*   Cardiac Enzymes:  Recent Labs Lab 03/09/14 1138 03/14/14 1050  TROPONINI <0.30 <0.30    BNP (last 3 results) No results for input(s): PROBNP in the last 8760 hours. CBG: No results for input(s): GLUCAP in the last 168 hours.  Radiological Exams on Admission: Dg Hip Complete Right  03/14/2014   CLINICAL DATA:  Fall at nursing home with right hip pain.  EXAM: RIGHT HIP - COMPLETE 2+ VIEW  COMPARISON:  02/06/2014  FINDINGS: There is diffuse decreased bone mineralization. Intra medullary nail unchanged. There is a moderately displaced intertrochanteric fracture of the right femoral neck. There are mild symmetric degenerative changes of the hips. There are degenerative changes of the spine. Calcified plaque over the iliac and femoral arteries.  IMPRESSION: Moderately displaced intertrochanteric fracture of the right femoral neck.   Electronically Signed   By: Elberta Fortis M.D.   On: 03/14/2014 12:08   Dg Chest Port 1 View  03/14/2014   CLINICAL DATA:  Weakness and renal failure.  L1 day prior  EXAM: PORTABLE CHEST - 1 VIEW  COMPARISON:  March 09, 2014  FINDINGS: There is a sizable hiatal hernia. There is no edema or consolidation. Heart is mildly prominent with pulmonary vascularity within normal limits. No adenopathy. No pneumothorax. There is thoracic dextroscoliosis. There is arthropathy in both shoulders with evidence of chronic rotator cuff  tears.  IMPRESSION: No edema or consolidation. Sizable hiatal hernia. No apparent pneumothorax. Heart prominent but stable.   Electronically Signed   By: Bretta Bang M.D.   On: 03/14/2014 11:35    EKG: NSR RBBB  Assessment/Plan Active Problems:   Femoral neck fracture: related to mechanical fracture witnessed by facility staff. No LOC. No head injury. Patient baseline mentation is pleasantly confused and is at risk for fall. Admit to medical floor. Await ortho input. Pain management.   Hypothermia: etiology unclear. Clinical picture not proportional to temp. Will repeat. Hemodynamically stable, no leukocytosis, urinalysis with RBC's but not consistent infection, chest xray no indication infiltrate. Warming blanket. monitor    Anemia: Hg  less than baseline. No s/sx bleeding. Urinalyis with RBC's. Obtain anemia panel. Monitor closely    Organic brain syndrome: currently not at baseline somewhat lethargic likely related to above. Will check A1c, folate, B12 TSH    CKD (chronic kidney disease), stage III: stable at baseline.     Dehydration: gentle IV hydration. monitor    Protein-calorie malnutrition, severe: BMI 19.6  Per Chales AbrahamsGupta perioperative cardiac rick 5.4%.      Code Status: DNR DVT Prophylaxis: Family Communication: facility staff at bedside Disposition Plan: likely snf at discharge  Time spent: 75 minutes  [facility] SouthBLACK,KAREN M [facility] Pager [phone]

## 2014-03-14 NOTE — ED Notes (Signed)
[facility] and landing on right side. Staff unsure if patient hit her head. Denies any LOC. Patient not c/o any pain yesterday per staff but started to c/o pain in right hip and leg this morning. Per staff no change in her mental status but she is not as active as she normally is per staff. Staff also reports that patient has not voided since yesterday.

## 2014-03-14 NOTE — ED Provider Notes (Addendum)
CSN: [id number]     Arrival date & time 03/14/14  4098 History  This chart was scribed for Donnetta Hutching, MD by Milly Jakob, ED Scribe. The patient was seen in room APA16A/APA16A. Patient's care was started at 10:22 AM.   Chief Complaint  Patient presents with  . Fall   The history is provided by the patient. No language interpreter was used.   HPI Comments: Level 5 Caveat: Dementia Kendra [name] is a 80 y.o. female with a history of dementia who presents to the Emergency Department with her manager from [facility] care facility after a fall yesterday. She normally ambulates with assistance, and she fell when trying to walk alone. Today she has been complaining of leg pain associated with the fall.  Her manager reports confusion associated with dementia, and reduced activity and muted behavior compared with baseline. Her manager reports that she has stage 3 renal failure diagnosed last week by Dr. Phill Myron. She has a daughter in Social worker and grandchildren only. According to the manger, her daughter has requested DNR.  Past Medical History  Diagnosis Date  . Organic brain syndrome   . Hard of hearing   . Chronic alcohol abuse   . Hyperlipidemia   . Fall   . CKD (chronic kidney disease), stage III   . CVA (cerebral infarction)   . Dementia   . Compression fx, lumbar spine     L1 and L2 2015   Past Surgical History  Procedure Laterality Date  . Chronic alcohol abuse    . Fracture surgery    . Femur fracture surgery     History reviewed. No pertinent family history. History  Substance Use Topics  . Smoking status: Never Smoker   . Smokeless tobacco: Never Used  . Alcohol Use: No   OB History    No data available     Review of Systems  Unable to perform ROS Musculoskeletal: Arthralgias: leg pain.  Level 5 Caveat: Dementia   Allergies  Review of patient's allergies indicates no known allergies.  Home Medications   Prior to Admission medications   Medication Sig Start  Date End Date Taking? Authorizing Provider  aspirin 325 MG tablet Take 325 mg by mouth daily.   Yes Historical Provider, MD  cloNIDine (CATAPRES) 0.1 MG tablet Take 1 tablet (0.1 mg total) by mouth 3 (three) times daily. 03/11/14  Yes Angus Edilia Bo, MD  docusate sodium (COLACE) 100 MG capsule Take 100 mg by mouth at bedtime.   Yes Historical Provider, MD  feeding supplement (ENSURE IMMUNE HEALTH) LIQD Take 237 mLs by mouth daily as needed (meal replacement).   Yes Historical Provider, MD  simvastatin (ZOCOR) 20 MG tablet Take 20 mg by mouth every evening.   Yes Historical Provider, MD  thioridazine (MELLARIL) 10 MG tablet Take 10 mg by mouth 2 (two) times daily.   Yes Historical Provider, MD  atorvastatin (LIPITOR) 10 MG tablet Take 1 tablet (10 mg total) by mouth daily at 6 PM. Patient not taking: Reported on 03/14/2014 02/14/14   [name], MD   Triage Vitals: BP 161/96 mmHg  Pulse 54  Temp(Src) 92.2 F (33.4 C) (Rectal)  Resp 18  Ht 5' (1.524 m)  Wt 100 lb (45.36 kg)  BMI 19.53 kg/m2  SpO2 100% Physical Exam  Constitutional: She is oriented to person, place, and time. She appears well-developed and well-nourished.  Obtunded, hypothermic  HENT:  Head: Normocephalic and atraumatic.  Eyes: Conjunctivae and EOM are normal.  Pupils are equal, round, and reactive to light.  Neck: Normal range of motion. Neck supple.  Cardiovascular: Normal rate, regular rhythm and normal heart sounds.   Pulmonary/Chest: Effort normal and breath sounds normal.  Abdominal: Soft. Bowel sounds are normal.  Musculoskeletal: Normal range of motion.  Unable  Neurological: She is alert and oriented to person, place, and time.  Skin: Skin is warm and dry.  Psychiatric: She has a normal mood and affect. Her behavior is normal.  Unable  Nursing note and vitals reviewed.   ED Course  Procedures (including critical care time) DIAGNOSTIC STUDIES: Oxygen Saturation is 100% on room air, normal by my  interpretation.    COORDINATION OF CARE: 10:30 AM-Discussed treatment plan which includes lab work with pt at bedside and pt agreed to plan.   Labs Review Labs Reviewed  CBC WITH DIFFERENTIAL - Abnormal; Notable for the following:    RBC 2.42 (*)    Hemoglobin 7.2 (*)    HCT 21.4 (*)    Platelets 104 (*)    Neutrophils Relative % 79 (*)    Lymphs Abs 0.5 (*)    All other components within normal limits  COMPREHENSIVE METABOLIC PANEL - Abnormal; Notable for the following:    Glucose, Bld 116 (*)    BUN 30 (*)    Creatinine, Ser 1.30 (*)    Calcium 8.1 (*)    Total Protein 5.7 (*)    Albumin 2.6 (*)    Total Bilirubin 0.2 (*)    GFR calc non Af Amer 34 (*)    GFR calc Af Amer 39 (*)    All other components within normal limits  TROPONIN I  URINALYSIS, ROUTINE W REFLEX MICROSCOPIC    Imaging Review Dg Hip Complete Right  03/14/2014   CLINICAL DATA:  Fall at nursing home with right hip pain.  EXAM: RIGHT HIP - COMPLETE 2+ VIEW  COMPARISON:  02/06/2014  FINDINGS: There is diffuse decreased bone mineralization. Intra medullary nail unchanged. There is a moderately displaced intertrochanteric fracture of the right femoral neck. There are mild symmetric degenerative changes of the hips. There are degenerative changes of the spine. Calcified plaque over the iliac and femoral arteries.  IMPRESSION: Moderately displaced intertrochanteric fracture of the right femoral neck.   Electronically Signed   By: Elberta Fortisaniel  Boyle M.D.   On: 03/14/2014 12:08   Dg Chest Port 1 View  03/14/2014   CLINICAL DATA:  Weakness and renal failure.  L1 day prior  EXAM: PORTABLE CHEST - 1 VIEW  COMPARISON:  March 09, 2014  FINDINGS: There is a sizable hiatal hernia. There is no edema or consolidation. Heart is mildly prominent with pulmonary vascularity within normal limits. No adenopathy. No pneumothorax. There is thoracic dextroscoliosis. There is arthropathy in both shoulders with evidence of chronic rotator  cuff tears.  IMPRESSION: No edema or consolidation. Sizable hiatal hernia. No apparent pneumothorax. Heart prominent but stable.   Electronically Signed   By: Bretta BangWilliam  Woodruff M.D.   On: 03/14/2014 11:35     EKG Interpretation   Date/Time:  Tuesday March 14 2014 11:08:33 EST Ventricular Rate:  49 PR Interval:  185 QRS Duration: 139 QT Interval:  538 QTC Calculation: 486 R Axis:   -42 Text Interpretation:  Sinus bradycardia RBBB and LAFB Confirmed by Marchella Hibbard   MD, Qualyn Oyervides ([id number]) on 03/14/2014 12:07:08 PM     CRITICAL CARE Performed by: Donnetta HutchingOOK,Kennice Finnie  ?  Total critical care time: 35  Critical care time was exclusive  of separately billable procedures and treating other patients.  Critical care was necessary to treat or prevent imminent or life-threatening deterioration.  Critical care was time spent personally by me on the following activities: development of treatment plan with patient and/or surrogate as well as nursing, discussions with consultants, evaluation of patient's response to treatment, examination of patient, obtaining history from patient or surrogate, ordering and performing treatments and interventions, ordering and review of laboratory studies, ordering and review of radiographic studies, pulse oximetry and re-evaluation of patient's condition. MDM   Final diagnoses:  Weakness  Femoral neck fracture, right, closed, initial encounter  Hypothermia, initial encounter   Patient found on floor at rest home. She is hypothermic. Plain films of right hip reveal a moderately displaced intertrochanteric fracture. Discussed with Dr. Romeo AppleHarrison. Bear hugger applied. IV fluids. Admit to general medicine.  I personally performed the services described in this documentation, which was scribed in my presence. The recorded information has been reviewed and is accurate.    Donnetta HutchingBrian Guila Owensby, MD 03/14/14 1342  Donnetta HutchingBrian Dietrick Barris, MD 03/14/14 [phone]

## 2014-03-14 NOTE — ED Notes (Signed)
bair hugger applied.

## 2014-03-14 NOTE — ED Notes (Signed)
Nurse tech in room for foley, alerted me patient is complaining of pan to right leg. Pt right hip swollen, tender and rotated inward. Sitter (from nursing facility) at bedside states patient fell yesterday on right side. Dr. Adriana Simasook notified, right hip xray ordered.

## 2014-03-15 DIAGNOSIS — S72001A Fracture of unspecified part of neck of right femur, initial encounter for closed fracture: Principal | ICD-10-CM

## 2014-03-15 DIAGNOSIS — E860 Dehydration: Secondary | ICD-10-CM

## 2014-03-15 LAB — IRON AND TIBC
IRON: 21 ug/dL — AB (ref 42–135)
Iron: 23 ug/dL — ABNORMAL LOW (ref 42–135)
Saturation Ratios: 11 % — ABNORMAL LOW (ref 20–55)
Saturation Ratios: 13 % — ABNORMAL LOW (ref 20–55)
TIBC: 184 ug/dL — ABNORMAL LOW (ref 250–470)
TIBC: 171 ug/dL — ABNORMAL LOW (ref 250–470)
UIBC: 163 ug/dL (ref 125–400)
UIBC: 148 ug/dL (ref 125–400)

## 2014-03-15 LAB — ABO/RH: ABO/RH(D): A POS

## 2014-03-15 LAB — TSH: TSH: 1.86 u[IU]/mL (ref 0.350–4.500)

## 2014-03-15 LAB — COMPREHENSIVE METABOLIC PANEL
ALK PHOS: 53 U/L (ref 39–117)
ALT: 8 U/L (ref 0–35)
AST: 16 U/L (ref 0–37)
Albumin: 2.3 g/dL — ABNORMAL LOW (ref 3.5–5.2)
Anion gap: 10 (ref 5–15)
BILIRUBIN TOTAL: 0.3 mg/dL (ref 0.3–1.2)
BUN: 27 mg/dL — ABNORMAL HIGH (ref 6–23)
CO2: 17 mEq/L — ABNORMAL LOW (ref 19–32)
Calcium: 7.7 mg/dL — ABNORMAL LOW (ref 8.4–10.5)
Chloride: 117 mEq/L — ABNORMAL HIGH (ref 96–112)
Creatinine, Ser: 1.39 mg/dL — ABNORMAL HIGH (ref 0.50–1.10)
GFR calc non Af Amer: 31 mL/min — ABNORMAL LOW (ref >90)
GFR, EST AFRICAN AMERICAN: 36 mL/min — AB (ref >90)
Glucose, Bld: 76 mg/dL (ref 70–99)
POTASSIUM: 4.7 meq/L (ref 3.7–5.3)
SODIUM: 144 meq/L (ref 137–147)
Total Protein: 5.1 g/dL — ABNORMAL LOW (ref 6.0–8.3)

## 2014-03-15 LAB — VITAMIN B12
VITAMIN B 12: 470 pg/mL (ref 211–911)
Vitamin B-12: 533 pg/mL (ref 211–911)

## 2014-03-15 LAB — FERRITIN
FERRITIN: 162 ng/mL (ref 10–291)
Ferritin: 203 ng/mL (ref 10–291)

## 2014-03-15 LAB — PREPARE RBC (CROSSMATCH)

## 2014-03-15 LAB — HEMOGLOBIN AND HEMATOCRIT, BLOOD
HEMATOCRIT: 30.3 % — AB (ref 36.0–46.0)
HEMOGLOBIN: 10.4 g/dL — AB (ref 12.0–15.0)

## 2014-03-15 LAB — CBC
HCT: 19.2 % — ABNORMAL LOW (ref 36.0–46.0)
Hemoglobin: 6.3 g/dL — CL (ref 12.0–15.0)
MCH: 29.3 pg (ref 26.0–34.0)
MCHC: 32.8 g/dL (ref 30.0–36.0)
MCV: 89.3 fL (ref 78.0–100.0)
PLATELETS: 107 10*3/uL — AB (ref 150–400)
RBC: 2.15 MIL/uL — ABNORMAL LOW (ref 3.87–5.11)
RDW: 15.5 % (ref 11.5–15.5)
WBC: 4.2 10*3/uL (ref 4.0–10.5)

## 2014-03-15 LAB — FOLATE
Folate: 13.5 ng/mL
Folate: 14.3 ng/mL

## 2014-03-15 LAB — RETICULOCYTES
RBC.: 2.12 MIL/uL — ABNORMAL LOW (ref 3.87–5.11)
RETIC COUNT ABSOLUTE: 48.8 10*3/uL (ref 19.0–186.0)
RETIC CT PCT: 2.3 % (ref 0.4–3.1)

## 2014-03-15 LAB — TROPONIN I

## 2014-03-15 MED ORDER — ENSURE COMPLETE PO LIQD
237.0000 mL | Freq: Two times a day (BID) | ORAL | Status: DC
  Administered 2014-03-15 – 2014-03-18 (×3): 237 mL via ORAL

## 2014-03-15 MED ORDER — SODIUM CHLORIDE 0.9 % IV SOLN: Freq: Once | INTRAVENOUS | Status: DC

## 2014-03-15 NOTE — Progress Notes (Signed)
CRITICAL VALUE ALERT  Critical value received:  Hgb 6.3  Date of notification:  11.25.15  Time of notification:  0653  Critical value read back:Yes.    Nurse who received alert:  Alvin CritchleyVictoria Woodward Klem, RN  MD notified (1st page):  McInnis  Time of first page:  [phone]  Responding MD:  Renard MatterMcInnis  Time MD responded:  [phone]

## 2014-03-15 NOTE — Clinical Social Work Note (Signed)
CSW discussed pt with MD this morning. MD feels DSS involvement is necessary due to frequent admissions in last month and falls. CSW spoke with APS supervisor at DSS, Sheliah PlaneStephanie Wingfield. She states she will discuss with case worker assigned to facility and they will determine if further investigation is necessary.   Derenda FennelKara Emmeline Winebarger, [place] [phone]

## 2014-03-15 NOTE — Consult Note (Signed)
Reason for Consult: Right hip fracture Referring Physician: Dr Philbert Riser is an 80 y.o. female.  HPI: 3rd admission in the last 2 months for this 80 year old female presents with another fall and hypothermia. She now has a right hip fracture. Age indeterminate. She was found again on the floor time she had been there is not clear. Remaining history is reviewed obtained from the medical record as the patient has severe dementia  Past Medical History  Diagnosis Date  . Organic brain syndrome   . Hard of hearing   . Chronic alcohol abuse   . Hyperlipidemia   . Fall   . CKD (chronic kidney disease), stage III   . CVA (cerebral infarction)   . Dementia   . Compression fx, lumbar spine     L1 and L2 2015    Past Surgical History  Procedure Laterality Date  . Chronic alcohol abuse    . Fracture surgery    . Femur fracture surgery      History reviewed. No pertinent family history.  Social History:  reports that she has never smoked. She has never used smokeless tobacco. She reports that she does not drink alcohol or use illicit drugs.  Allergies: No Known Allergies  Medications: I have reviewed the patient's current medications.  Results for orders placed or performed during the hospital encounter of 03/14/14 (from the past 48 hour(s))  CBC with Differential     Status: Abnormal   Collection Time: 03/14/14 10:50 AM  Result Value Ref Range   WBC 4.4 4.0 - 10.5 K/uL   RBC 2.42 (L) 3.87 - 5.11 MIL/uL   Hemoglobin 7.2 (L) 12.0 - 15.0 g/dL   HCT 21.4 (L) 36.0 - 46.0 %   MCV 88.4 78.0 - 100.0 fL   MCH 29.8 26.0 - 34.0 pg   MCHC 33.6 30.0 - 36.0 g/dL   RDW 15.1 11.5 - 15.5 %   Platelets 104 (L) 150 - 400 K/uL    Comment: PLATELETS APPEAR DECREASED SPECIMEN CHECKED FOR CLOTS PLATELET COUNT CONFIRMED BY SMEAR    Neutrophils Relative % 79 (H) 43 - 77 %   Neutro Abs 3.5 1.7 - 7.7 K/uL   Lymphocytes Relative 12 12 - 46 %   Lymphs Abs 0.5 (L) 0.7 - 4.0 K/uL   Monocytes Relative 9 3 - 12 %   Monocytes Absolute 0.4 0.1 - 1.0 K/uL   Eosinophils Relative 0 0 - 5 %   Eosinophils Absolute 0.0 0.0 - 0.7 K/uL   Basophils Relative 0 0 - 1 %   Basophils Absolute 0.0 0.0 - 0.1 K/uL  Comprehensive metabolic panel     Status: Abnormal   Collection Time: 03/14/14 10:50 AM  Result Value Ref Range   Sodium 140 137 - 147 mEq/L   Potassium 4.9 3.7 - 5.3 mEq/L   Chloride 109 96 - 112 mEq/L   CO2 19 19 - 32 mEq/L   Glucose, Bld 116 (H) 70 - 99 mg/dL   BUN 30 (H) 6 - 23 mg/dL   Creatinine, Ser 1.30 (H) 0.50 - 1.10 mg/dL   Calcium 8.1 (L) 8.4 - 10.5 mg/dL   Total Protein 5.7 (L) 6.0 - 8.3 g/dL   Albumin 2.6 (L) 3.5 - 5.2 g/dL   AST 14 0 - 37 U/L   ALT 8 0 - 35 U/L   Alkaline Phosphatase 61 39 - 117 U/L   Total Bilirubin 0.2 (L) 0.3 - 1.2 mg/dL   GFR calc  non Af Amer 34 (L) >90 mL/min   GFR calc Af Amer 39 (L) >90 mL/min    Comment: (NOTE) The eGFR has been calculated using the CKD EPI equation. This calculation has not been validated in all clinical situations. eGFR's persistently <90 mL/min signify possible Chronic Kidney Disease.    Anion gap 12 5 - 15  Troponin I     Status: None   Collection Time: 03/14/14 10:50 AM  Result Value Ref Range   Troponin I <0.30 <0.30 ng/mL    Comment:        Due to the release kinetics of cTnI, a negative result within the first hours of the onset of symptoms does not rule out myocardial infarction with certainty. If myocardial infarction is still suspected, repeat the test at appropriate intervals.   Reticulocytes     Status: Abnormal   Collection Time: 03/14/14 10:50 AM  Result Value Ref Range   Retic Ct Pct 2.3 0.4 - 3.1 %   RBC. 2.42 (L) 3.87 - 5.11 MIL/uL   Retic Count, Manual 55.7 19.0 - 186.0 K/uL  TSH     Status: None   Collection Time: 03/14/14 10:50 AM  Result Value Ref Range   TSH 1.860 0.350 - 4.500 uIU/mL    Comment: Performed at [facility]  Urinalysis, Routine w reflex microscopic      Status: Abnormal   Collection Time: 03/14/14  1:06 PM  Result Value Ref Range   Color, Urine YELLOW YELLOW   APPearance HAZY (A) CLEAR   Specific Gravity, Urine >1.030 (H) 1.005 - 1.030   pH 5.0 5.0 - 8.0   Glucose, UA NEGATIVE NEGATIVE mg/dL   Hgb urine dipstick LARGE (A) NEGATIVE   Bilirubin Urine NEGATIVE NEGATIVE   Ketones, ur NEGATIVE NEGATIVE mg/dL   Protein, ur TRACE (A) NEGATIVE mg/dL   Urobilinogen, UA 1.0 0.0 - 1.0 mg/dL   Nitrite NEGATIVE NEGATIVE   Leukocytes, UA TRACE (A) NEGATIVE  Urine microscopic-add on     Status: Abnormal   Collection Time: 03/14/14  1:06 PM  Result Value Ref Range   Squamous Epithelial / LPF FEW (A) RARE   WBC, UA 0-2 <3 WBC/hpf   RBC / HPF TOO NUMEROUS TO COUNT <3 RBC/hpf   Bacteria, UA RARE RARE  Troponin I     Status: None   Collection Time: 03/14/14  5:35 PM  Result Value Ref Range   Troponin I <0.30 <0.30 ng/mL    Comment:        Due to the release kinetics of cTnI, a negative result within the first hours of the onset of symptoms does not rule out myocardial infarction with certainty. If myocardial infarction is still suspected, repeat the test at appropriate intervals.   Vitamin B12     Status: None   Collection Time: 03/14/14  5:35 PM  Result Value Ref Range   Vitamin B-12 470 211 - 911 pg/mL    Comment: Performed at [place] and TIBC     Status: Abnormal   Collection Time: 03/14/14  5:35 PM  Result Value Ref Range   Iron 21 (L) 42 - 135 ug/dL   TIBC 184 (L) 250 - 470 ug/dL   Saturation Ratios 11 (L) 20 - 55 %   UIBC 163 125 - 400 ug/dL    Comment: Performed at [facility]  Ferritin     Status: None   Collection Time: 03/14/14  5:35 PM  Result Value  Ref Range   Ferritin 203 10 - 291 ng/mL    Comment: Performed at [facility]  Folate     Status: None   Collection Time: 03/14/14  5:35 PM  Result Value Ref Range   Folate 13.5 ng/mL    Comment: (NOTE) Reference Ranges         Deficient:       0.4 - 3.3 ng/mL        Indeterminate:   3.4 - 5.4 ng/mL        Normal:              > 5.4 ng/mL Performed at [facility]   Comprehensive metabolic panel     Status: Abnormal   Collection Time: 03/15/14  6:19 AM  Result Value Ref Range   Sodium 144 137 - 147 mEq/L   Potassium 4.7 3.7 - 5.3 mEq/L   Chloride 117 (H) 96 - 112 mEq/L   CO2 17 (L) 19 - 32 mEq/L   Glucose, Bld 76 70 - 99 mg/dL   BUN 27 (H) 6 - 23 mg/dL   Creatinine, Ser 1.39 (H) 0.50 - 1.10 mg/dL   Calcium 7.7 (L) 8.4 - 10.5 mg/dL   Total Protein 5.1 (L) 6.0 - 8.3 g/dL   Albumin 2.3 (L) 3.5 - 5.2 g/dL   AST 16 0 - 37 U/L   ALT 8 0 - 35 U/L   Alkaline Phosphatase 53 39 - 117 U/L   Total Bilirubin 0.3 0.3 - 1.2 mg/dL   GFR calc non Af Amer 31 (L) >90 mL/min   GFR calc Af Amer 36 (L) >90 mL/min    Comment: (NOTE) The eGFR has been calculated using the CKD EPI equation. This calculation has not been validated in all clinical situations. eGFR's persistently <90 mL/min signify possible Chronic Kidney Disease.    Anion gap 10 5 - 15  CBC     Status: Abnormal   Collection Time: 03/15/14  6:19 AM  Result Value Ref Range   WBC 4.2 4.0 - 10.5 K/uL   RBC 2.15 (L) 3.87 - 5.11 MIL/uL   Hemoglobin 6.3 (LL) 12.0 - 15.0 g/dL    Comment: CRITICAL RESULT CALLED TO, READ BACK BY AND VERIFIED WITH: CUMMINGS,V AT 0650 BY HUFFINES,S ON 03/15/14    HCT 19.2 (L) 36.0 - 46.0 %   MCV 89.3 78.0 - 100.0 fL   MCH 29.3 26.0 - 34.0 pg   MCHC 32.8 30.0 - 36.0 g/dL   RDW 15.5 11.5 - 15.5 %   Platelets 107 (L) 150 - 400 K/uL    Comment: SPECIMEN CHECKED FOR CLOTS  Troponin I     Status: None   Collection Time: 03/15/14  6:19 AM  Result Value Ref Range   Troponin I <0.30 <0.30 ng/mL    Comment:        Due to the release kinetics of cTnI, a negative result within the first hours of the onset of symptoms does not rule out myocardial infarction with certainty. If myocardial infarction is still suspected, repeat  the test at appropriate intervals.   Reticulocytes     Status: Abnormal   Collection Time: 03/15/14  8:16 AM  Result Value Ref Range   Retic Ct Pct 2.3 0.4 - 3.1 %   RBC. 2.12 (L) 3.87 - 5.11 MIL/uL   Retic Count, Manual 48.8 19.0 - 186.0 K/uL    Dg Hip Complete Right  03/14/2014   CLINICAL DATA:  Fall at  nursing home with right hip pain.  EXAM: RIGHT HIP - COMPLETE 2+ VIEW  COMPARISON:  02/06/2014  FINDINGS: There is diffuse decreased bone mineralization. Intra medullary nail unchanged. There is a moderately displaced intertrochanteric fracture of the right femoral neck. There are mild symmetric degenerative changes of the hips. There are degenerative changes of the spine. Calcified plaque over the iliac and femoral arteries.  IMPRESSION: Moderately displaced intertrochanteric fracture of the right femoral neck.   Electronically Signed   By: Marin Olp M.D.   On: 03/14/2014 12:08   Dg Chest Port 1 View  03/14/2014   CLINICAL DATA:  Weakness and renal failure.  L1 day prior  EXAM: PORTABLE CHEST - 1 VIEW  COMPARISON:  March 09, 2014  FINDINGS: There is a sizable hiatal hernia. There is no edema or consolidation. Heart is mildly prominent with pulmonary vascularity within normal limits. No adenopathy. No pneumothorax. There is thoracic dextroscoliosis. There is arthropathy in both shoulders with evidence of chronic rotator cuff tears.  IMPRESSION: No edema or consolidation. Sizable hiatal hernia. No apparent pneumothorax. Heart prominent but stable.   Electronically Signed   By: Lowella Grip M.D.   On: 03/14/2014 11:35    Review of Systems  Unable to perform ROS: dementia   Blood pressure 135/98, pulse 107, temperature 97.2 F (36.2 C), temperature source Rectal, resp. rate 18, height 5' (1.524 m), weight 110 lb (49.896 kg), SpO2 90 %. Physical Exam The patient has severe contractures she has internal rotation shortening deformity of the right lower extremity.  Perfusion to all 4  extremities is normal  She properly responds to pain stimuli.  Dementia prevents any mental status exam.  Tenderness over the right greater trochanter internal rotation deformity shortening of the limb muscle tone normal. Did not detect any joint subluxation in any of the 4 extremities  Groin areas had normal lymph node palpation.  Patient nonambulatory secondary to fracture. Previous ambulatory status unknown   Assessment/Plan: Recommend transfer to tertiary care facility because:  Hypothermia Severe anemia Co morbid coditions Age Also recommend DSS evaluate: several recent admits (10/19, 11/19 and again yesterday), from falls etc   I would also consider comfort care as alternative to surgery    Needs to be transfused   Arther Abbott 03/15/2014, 9:05 AM

## 2014-03-15 NOTE — Progress Notes (Signed)
UR chart review completed.  

## 2014-03-15 NOTE — Progress Notes (Signed)
INITIAL NUTRITION ASSESSMENT  DOCUMENTATION CODES Per approved criteria  -Severe malnutrition in the context of chronic illness   Pt meets criteria for severe MALNUTRITION in the context of chronic illness as evidenced by severe fat and muscle depletion.  INTERVENTION: Ensure Complete po BID, each supplement provides 350 kcal and 13 grams of protein  NUTRITION DIAGNOSIS: Inadequate oral intake related to decreased appetite as evidenced by PO: 0-5%.   Goal: Pt will meet >90% of estimated nutritional needs  Monitor:  PO/supplement intake, labs, weight changes, I/O's  Reason for Assessment: MST=2  80 y.o. female  Admitting Dx: <principal problem not specified>  Wynona CanesMattie E Clinton SawyerWilliamson is a 80 y.o. female presents to ED from SNF with CC fall. Initial work up revealed right hip fracture and hypothermia and anemia. Facility staff report patient attempted to get up out of bed alone and fell. She requires assistance with ambulation. Immediately complained of right leg pain. No hx fever chill, nausea/vomiting, diarrhea. No complaints of chest pain, headache dizziness or syncope.   ASSESSMENT: Pt unable to participate in interview due to cognitive decline. No family or caregivers present at time of visit. Noted poor meal intake (0-5%). Pt only drank juice off breakfast tray.  Noted progressive wt gain over the past month, however, question accuracy of weights. MAR reveals pt receives 237 ml Ensure daily.  Per MD, pt is a high risk surgical candidate. Per surgeon, pt will need transfer to tertiary care facility if surgery is pursued. MD to speak with family regarding treatment plan (surgery vs comfort care). CSW and DSS also involved in case due to multiple admissions and falls.  Labs reviewed. Cl: 117, CO2: 17, BUN/Creat: 27/1.39, Calcium: 7.7.  Nutrition Focused Physical Exam:  Subcutaneous Fat:  Orbital Region: severe depletion Upper Arm Region: severe depletion Thoracic and Lumbar  Region: n/a  Muscle:  Temple Region: severe depletion Clavicle Bone Region: severe depletion Clavicle and Acromion Bone Region: moderate depletion Scapular Bone Region: severe depletion Dorsal Hand: severe depletion Patellar Region: n/a Anterior Thigh Region: n/a Posterior Calf Region: n/a  Edema: none present  Height: Ht Readings from Last 1 Encounters:  03/14/14 5' (1.524 m)    Weight: Wt Readings from Last 1 Encounters:  03/14/14 110 lb (49.896 kg)    Ideal Body Weight: 100#  % Ideal Body Weight: 110%  Wt Readings from Last 10 Encounters:  03/14/14 110 lb (49.896 kg)  03/09/14 100 lb 12 oz (45.7 kg)  02/06/14 99 lb 3.3 oz (45 kg)  02/03/14 95 lb (43.092 kg)  08/29/13 95 lb 10.9 oz (43.4 kg)    Usual Body Weight: 95#  % Usual Body Weight: 116%  BMI:  Body mass index is 21.48 kg/(m^2). Normal weight range  Estimated Nutritional Needs: Kcal: 1300-1500 Protein: 55-65 grams Fluid: 1.3-1.5 L  Skin: skin tear on lt anterior arm  Diet Order: Diet regular  EDUCATION NEEDS: -Education not appropriate at this time   Intake/Output Summary (Last 24 hours) at 03/15/14 1042 Last data filed at 03/15/14 0643  Gross per 24 hour  Intake  152.5 ml  Output    350 ml  Net -197.5 ml    Last BM: 03/14/14  Labs:   Recent Labs Lab 03/10/14 0538 03/14/14 1050 03/15/14 0619  NA 140 140 144  K 5.0 4.9 4.7  CL 110 109 117*  CO2 18* 19 17*  BUN 24* 30* 27*  CREATININE 1.30* 1.30* 1.39*  CALCIUM 8.3* 8.1* 7.7*  GLUCOSE 69* 116* 76  CBG (last 3)  No results for input(s): GLUCAP in the last 72 hours.  Scheduled Meds: . sodium chloride   Intravenous Once  . sodium chloride   Intravenous Once  . cloNIDine  0.1 mg Oral TID  . docusate sodium  100 mg Oral QHS  . simvastatin  20 mg Oral QPM  . thioridazine  10 mg Oral BID    Continuous Infusions: . sodium chloride 75 mL/hr at 03/15/14 0445    Past Medical History  Diagnosis Date  . Organic brain  syndrome   . Hard of hearing   . Chronic alcohol abuse   . Hyperlipidemia   . Fall   . CKD (chronic kidney disease), stage III   . CVA (cerebral infarction)   . Dementia   . Compression fx, lumbar spine     L1 and L2 2015    Past Surgical History  Procedure Laterality Date  . Chronic alcohol abuse    . Fracture surgery    . Femur fracture surgery      Riyaan Heroux A. Mayford KnifeWilliams, RD, LDN Pager: [phone]

## 2014-03-15 NOTE — Care Management Note (Addendum)
    Page 1 of 1   03/17/2014     2:27:40 PM CARE MANAGEMENT NOTE 03/17/2014  Patient:  Kendra [name],Kendra [name]   Account Number:  [id number]  Date Initiated:  03/15/2014  Documentation initiated by:  Sharrie RothmanBLACKWELL,TAMMY C  Subjective/Objective Assessment:   [facility]. Pt will need higher level of care at discharge. Ortho at [facility] recommends transfer to another hospital if surgery pursued.     Action/Plan:   PCP is aware of needed transfer. CSW to arrange discharge to facility when medically stable for discharge.   Anticipated DC Date:  03/18/2014   Anticipated DC Plan:  SKILLED NURSING FACILITY  In-house referral  Clinical Social Worker      DC Planning Services  CM consult      Choice offered to / List presented to:             Status of service:  Completed, signed off Medicare Important Message given?  YES (If response is "NO", the following Medicare IM given date fields will be blank) Date Medicare IM given:  03/17/2014 Medicare IM given by:  [name],Jnaya Butrick Date Additional Medicare IM given:   Additional Medicare IM given by:    Discharge Disposition:  SKILLED NURSING FACILITY  Per UR Regulation:  Reviewed for med. necessity/level of care/duration of stay  If discussed at Long Length of Stay Meetings, dates discussed:    Comments:  03/17/14 1200 Anibal HendersonGeneva Lacreshia Bondarenko RN/CM Pt not a candidate for surgery, so is going to SNF,  due to being a total care pt, now, with fx hip. 03/15/14 1110 Arlyss Queenammy Blackwell, RN BSN CM

## 2014-03-15 NOTE — Progress Notes (Signed)
Subjective: The patient is fairly comfortable as morning. She had a fall prior to admission sustaining right hip fracture. She is also anemic with a hemoglobin of 7.2 g. The patient prior C2 fracture  Objective: Vital signs in last 24 hours: Temp:  [90.8 F (32.7 C)-98.5 F (36.9 C)] 98.1 F (36.7 C) (11/25 0442) Pulse Rate:  [54-80] 80 (11/24 2214) Resp:  [12-22] 18 (11/24 2214) BP: (103-163)/(48-96) 103/48 mmHg (11/24 2214) SpO2:  [99 %-100 %] 100 % (11/24 2214) Weight:  [45.36 kg (100 lb)-49.896 kg (110 lb)] 49.896 kg (110 lb) (11/24 1541) Weight change:  Last BM Date: 03/14/14  Intake/Output from previous day: 11/24 0701 - 11/25 0700 In: 152.5 [I.V.:152.5] Out: -  Intake/Output this shift:    Physical Exam: Gen. appearance the patient is oriented fairly comfortable  HEENT negative  Neck supple no JVD or thyroid abnormalities  Heart regular rhythm no murmurs  Lungs clear to P&A  Abdomen soft no palpable organs or masses  Musculoskeletal tenderness over right hip   Recent Labs  03/14/14 1050  WBC 4.4  HGB 7.2*  HCT 21.4*  PLT 104*   BMET  Recent Labs  03/14/14 1050  NA 140  K 4.9  CL 109  CO2 19  GLUCOSE 116*  BUN 30*  CREATININE 1.30*  CALCIUM 8.1*    Studies/Results: Dg Hip Complete Right  03/14/2014   CLINICAL DATA:  Fall at nursing home with right hip pain.  EXAM: RIGHT HIP - COMPLETE 2+ VIEW  COMPARISON:  02/06/2014  FINDINGS: There is diffuse decreased bone mineralization. Intra medullary nail unchanged. There is a moderately displaced intertrochanteric fracture of the right femoral neck. There are mild symmetric degenerative changes of the hips. There are degenerative changes of the spine. Calcified plaque over the iliac and femoral arteries.  IMPRESSION: Moderately displaced intertrochanteric fracture of the right femoral neck.   Electronically Signed   By: Elberta Fortisaniel  Boyle M.D.   On: 03/14/2014 12:08   Dg Chest Port 1 View  03/14/2014    CLINICAL DATA:  Weakness and renal failure.  L1 day prior  EXAM: PORTABLE CHEST - 1 VIEW  COMPARISON:  March 09, 2014  FINDINGS: There is a sizable hiatal hernia. There is no edema or consolidation. Heart is mildly prominent with pulmonary vascularity within normal limits. No adenopathy. No pneumothorax. There is thoracic dextroscoliosis. There is arthropathy in both shoulders with evidence of chronic rotator cuff tears.  IMPRESSION: No edema or consolidation. Sizable hiatal hernia. No apparent pneumothorax. Heart prominent but stable.   Electronically Signed   By: Bretta BangWilliam  Woodruff M.D.   On: 03/14/2014 11:35    Medications:  . cloNIDine  0.1 mg Oral TID  . docusate sodium  100 mg Oral QHS  . simvastatin  20 mg Oral QPM  . thioridazine  10 mg Oral BID    . sodium chloride 75 mL/hr at 03/14/14 1628     Assessment/Plan: 1. Displaced intratrochanteric fracture of right hip-plan patient will be seen by orthopedist  2. Previous fracture of C2  3. Organic brain syndrome with dementia  4. CK D stage III-anemia with hemoglobin 7.2-patient may need transfusion prior to surgery   LOS: 1 day   Nemesis Rainwater G 03/15/2014, 6:25 AM

## 2014-03-15 NOTE — Clinical Social Work Psychosocial (Signed)
Clinical Social Work Department BRIEF PSYCHOSOCIAL ASSESSMENT 03/15/2014  Patient:  Frutoso ChaseWILLIAMSON,Simranjit E     Account Number:  [id number]     Admit date:  03/14/2014  Clinical Social Worker:  Trilby LeaverSETTLE,Arrion Burruel, LCSW  Date/Time:  03/15/2014 12:00 N  Referred by:  CSW  Date Referred:  03/15/2014 Referred for  SNF Placement   Other Referral:   Interview type:  Patient Other interview type:   Alveta HeimlichGranddaughter, Joy Willliamson-Foster [phone]  Wynona MealsApil [facility], [name]    PSYCHOSOCIAL DATA Living Status:  FACILITY Admitted from facility:   Level of care:  Family Care Home Primary support name:  Gershon CraneJoy Lottman-Foster Primary support relationship to patient:  FAMILY Degree of support available:   Mrs. Eliseo SquiresWilliamson-Foster is patient's granddaughter. She lives out of state.  She visits when she is in town.    CURRENT CONCERNS Current Concerns  Post-Acute Placement   Other Concerns:    SOCIAL WORK ASSESSMENT / PLAN Patient was not able to participate in assessment due to dementia.  She was oriented to self only.  CSW [facility] with [facility]. Mrs. Everardo Allllison indicated that patient had fallen while walking to the dinner table.  She indicated that patient was using her walker when she fell. Mrs. Everardo Allllison indicated that patient has been a resident for over 20 years with the facility. She indicated that patient ambulates with the assistance of a walker.  Mrs. Everardo Allllison indicated that due to patient's continued issues with falling, the facility cannot accept patient upon discharge.  She stated that patient requires a higher level of care than can be provided in the family care setting.  She indicated that patient's family is out of state and that they are not able to visit with patient often.  CSW spoke with patient's granddaughter Gershon CraneJoy Obara-Foster.  Ms. Malen GauzeFoster confirmed that statements provided by the facility.  She indicated that she had held a conference call with  her siblings and they had agreed that patient needed a higher level of care than can be provided at [facility]. She stated that her brother was currently researching facilities.  Mrs. Malen GauzeFoster indicated that she was going to forward her brother the SNF list that CSW had previously emailed to them.  Mrs. Malen GauzeFoster gave permission for CSW to send patient clinicals to facilities in the area.  She also indicated that the family realizes that they may have to seek placement outside of the county.  [facility] and [facility].   Assessment/plan status:  [facility] Other assessment/ plan:   Information/referral to community resources:   CSW encouraged patient's granddaughter to review the previously sent SNF list that she has in her email.  Mrs. Malen GauzeFoster indicated that she still has the list and will refer to it.    PATIENT'S/FAMILY'S RESPONSE TO PLAN OF CARE: Family wants patient moved to a skilled facility upon discharge. Family agreeable for CSW to begin faxing patient's clinicals to facilities.    Tretha SciaraHeather Princessa Lesmeister, KentuckyLCSW [phone]

## 2014-03-15 NOTE — Plan of Care (Signed)
Problem: Consults Goal: Hip/Femur Fracture Patient Education See Patient Education Module for education specifics.  Outcome: Completed/Met Date Met:  03/15/14 Unable to educate patient due to confusion, spoke to grandaughter

## 2014-03-16 LAB — TYPE AND SCREEN
ABO/RH(D): A POS
Antibody Screen: NEGATIVE
UNIT DIVISION: 0
Unit division: 0

## 2014-03-16 NOTE — Plan of Care (Signed)
Problem: Consults Goal: Skin Care Protocol Initiated - if Braden Score 18 or less If consults are not indicated, leave blank or document N/A  Outcome: Completed/Met Date Met:  03/16/14 Skin care interventions applied include Q2 turns, sacral foam dressing for prophylaxis, pillow under heels, foot of bed elevated, HOB less than 30 degrees elevated. Goal: Nutrition Consult-if indicated Outcome: Completed/Met Date Met:  03/16/14 Ensure complete given between meals. Goal: Diabetes Guidelines if Diabetic/Glucose > 140 If diabetic or lab glucose is > 140 mg/dl - Initiate Diabetes/Hyperglycemia Guidelines & Document Interventions  Outcome: Not Applicable Date Met:  06/01/13  Problem: Phase II Progression Outcomes Goal: CMS/Neurovascular status WDL Outcome: Completed/Met Date Met:  03/16/14 Goal: Tolerating diet Outcome: Progressing Patient has decreased appetite; however, told us this afternoon she was hungry and drank whole ensure supplement. Goal: Bed to chair Outcome: Not Progressing Patient currently on strict bedrest.

## 2014-03-16 NOTE — Progress Notes (Signed)
Subjective: The patient is fairly comfortable this morning. She does have right hip fracture. This is intertrochanteric fracture right hip. She has had previous fracture of C2 vertebra. She also has severe anemia and was transfused yesterday.  Objective: Vital signs in last 24 hours: Temp:  [98.3 F (36.8 C)-99 F (37.2 C)] 99 F (37.2 C) (11/26 0700) Pulse Rate:  [68-82] 82 (11/26 0700) Resp:  [20] 20 (11/26 0700) BP: (109-169)/(44-76) 124/73 mmHg (11/26 0700) SpO2:  [93 %-100 %] 100 % (11/26 0700) Weight change:  Last BM Date: 03/14/14  Intake/Output from previous day: 11/25 0701 - 11/26 0700 In: 2804.5 [P.O.:140; I.V.:1984.5; Blood:680] Out: 180 [Urine:180] Intake/Output this shift:    Physical Exam: Gen. appearance patient is oriented and fairly comfortable  HEENT negative  Neck supple no JVD or thyroid abnormalities  Heart regular rhythm no murmurs  Lungs clear to P&A  Abdomen soft no palpable organs or masses  Musculoskeletal is tenderness over the right hip   Recent Labs  03/14/14 1050 03/15/14 0619 03/15/14 2001  WBC 4.4 4.2  --   HGB 7.2* 6.3* 10.4*  HCT 21.4* 19.2* 30.3*  PLT 104* 107*  --    BMET  Recent Labs  03/14/14 1050 03/15/14 0619  NA 140 144  K 4.9 4.7  CL 109 117*  CO2 19 17*  GLUCOSE 116* 76  BUN 30* 27*  CREATININE 1.30* 1.39*  CALCIUM 8.1* 7.7*    Studies/Results: Dg Hip Complete Right  03/14/2014   CLINICAL DATA:  Fall at nursing home with right hip pain.  EXAM: RIGHT HIP - COMPLETE 2+ VIEW  COMPARISON:  02/06/2014  FINDINGS: There is diffuse decreased bone mineralization. Intra medullary nail unchanged. There is a moderately displaced intertrochanteric fracture of the right femoral neck. There are mild symmetric degenerative changes of the hips. There are degenerative changes of the spine. Calcified plaque over the iliac and femoral arteries.  IMPRESSION: Moderately displaced intertrochanteric fracture of the right femoral  neck.   Electronically Signed   By: Elberta Fortisaniel  Boyle M.D.   On: 03/14/2014 12:08   Dg Chest Port 1 View  03/14/2014   CLINICAL DATA:  Weakness and renal failure.  L1 day prior  EXAM: PORTABLE CHEST - 1 VIEW  COMPARISON:  March 09, 2014  FINDINGS: There is a sizable hiatal hernia. There is no edema or consolidation. Heart is mildly prominent with pulmonary vascularity within normal limits. No adenopathy. No pneumothorax. There is thoracic dextroscoliosis. There is arthropathy in both shoulders with evidence of chronic rotator cuff tears.  IMPRESSION: No edema or consolidation. Sizable hiatal hernia. No apparent pneumothorax. Heart prominent but stable.   Electronically Signed   By: Bretta BangWilliam  Woodruff M.D.   On: 03/14/2014 11:35    Medications:  . sodium chloride   Intravenous Once  . sodium chloride   Intravenous Once  . cloNIDine  0.1 mg Oral TID  . docusate sodium  100 mg Oral QHS  . feeding supplement (ENSURE COMPLETE)  237 mL Oral BID BM  . simvastatin  20 mg Oral QPM  . thioridazine  10 mg Oral BID    . sodium chloride 75 mL/hr at 03/15/14 2259     Assessment/Plan: 1. Displaced intratrochanteric fracture right hip. Patient was seen by orthopedist Dr. Romeo AppleHarrison who feels the patient is not a candidate for surgery-to continue supportive care and comfort measures  2. Previous fracture C2 continue neck brace  3. Organic brain syndrome with dementia  4. CK D stage II  5. Anemia etiology undetermined will check stools for blood continue to monitor hemoglobin and hematocrit anemia panel   LOS: 2 days   Tonantzin Mimnaugh G 03/16/2014, 7:06 AM

## 2014-03-16 NOTE — Progress Notes (Signed)
Attempted to give patient her morning medications and nutritional supplement. Meds crushed and put in applesauce, but patient refuses to open her mouth to take them. She states "I don't want that". Will continue to monitor.

## 2014-03-17 LAB — URINE CULTURE
Colony Count: NO GROWTH
Culture: NO GROWTH

## 2014-03-17 MED ORDER — FERROUS SULFATE 325 (65 FE) MG PO TABS
325.0000 mg | ORAL_TABLET | Freq: Two times a day (BID) | ORAL | Status: DC
  Administered 2014-03-17 – 2014-03-18 (×3): 325 mg via ORAL
  Filled 2014-03-17 (×3): qty 1

## 2014-03-17 NOTE — Progress Notes (Signed)
Subjective: The patient appears to be fairly comfortable. She does have right hip fracture which is intratrochanteric fracture of hip. Also has fractured C2 vertebra and severe anemia she has been transfused for hemoglobin is 10.4 hematocrit 30.3  Objective: Vital signs in last 24 hours: Temp:  [99 F (37.2 C)-100.1 F (37.8 C)] 99 F (37.2 C) (11/27 0540) Pulse Rate:  [68-72] 72 (11/27 0540) Resp:  [18] 18 (11/27 0540) BP: (136-159)/(53-76) 136/68 mmHg (11/27 0540) SpO2:  [97 %-100 %] 100 % (11/27 0540) Weight:  [54.432 kg (120 lb)] 54.432 kg (120 lb) (11/27 0540) Weight change: -0.454 kg (-1 lb) Last BM Date: 03/14/14  Intake/Output from previous day: 11/26 0701 - 11/27 0700 In: 2380 [P.O.:230; I.V.:1600] Out: 700 [Urine:700] Intake/Output this shift:    Physical Exam: General appearance the patient is oriented and fairly comfortable  HEENT negative  Neck supple no JVD or thyroid abnormalities  Heart regular rhythm no murmurs  Lungs clear to P&A  Abdomen no palpable organs or masses  Musculoskeletal tenderness over right hip   Recent Labs  03/14/14 1050 03/15/14 0619 03/15/14 2001  WBC 4.4 4.2  --   HGB 7.2* 6.3* 10.4*  HCT 21.4* 19.2* 30.3*  PLT 104* 107*  --    BMET  Recent Labs  03/14/14 1050 03/15/14 0619  NA 140 144  K 4.9 4.7  CL 109 117*  CO2 19 17*  GLUCOSE 116* 76  BUN 30* 27*  CREATININE 1.30* 1.39*  CALCIUM 8.1* 7.7*    Studies/Results: No results found.  Medications:  . sodium chloride   Intravenous Once  . sodium chloride   Intravenous Once  . cloNIDine  0.1 mg Oral TID  . docusate sodium  100 mg Oral QHS  . feeding supplement (ENSURE COMPLETE)  237 mL Oral BID BM  . simvastatin  20 mg Oral QPM  . thioridazine  10 mg Oral BID    . sodium chloride 75 mL/hr at 03/17/14 0157     Assessment/Plan: 1. Displaced intratrochanteric fracture of right hip. Continue supportive care and comfort measures  2. Fracture C2 continue the  use met neck brace  3. Organic brain syndrome with dementia  4. CK D stage II-continue to monitor  5. Anemia-improved with transfusion continue to evaluate   LOS: 3 days   Lorn Butcher G 03/17/2014, 8:10 AM

## 2014-03-17 NOTE — Clinical Social Work Note (Signed)
CSW spoke to patient's granddaughter Gershon CraneJoy Droke-Foster [phone].  CSW provided bed offers to Ms. Foster from both [facility] and [facility].  Ms. Malen GauzeFoster accepted the bed offer with [facility].  CSW provided status update.  CSW notified Debbie at [facility] that patient's granddaughter had accepted bed offer.  CSW advised that per Dr. Renard MatterMcInnis, patient may be discharged this weekend.  CSW provided Debbie with Ms. Foster's contact information.    CSW notified Misty [facility]Lisa [facility] that patient's family had accepted a bed offer at another facility.  Tretha SciaraHeather Reyaansh [place], [place] [phone]

## 2014-03-18 LAB — OCCULT BLOOD X 1 CARD TO LAB, STOOL: Fecal Occult Bld: NEGATIVE

## 2014-03-18 MED ORDER — HYDROCODONE-ACETAMINOPHEN 5-325 MG PO TABS: 1.0000 | ORAL_TABLET | ORAL | Status: AC | PRN

## 2014-03-18 MED ORDER — BISACODYL 10 MG RE SUPP: 10.0000 mg | Freq: Every day | RECTAL | Status: AC | PRN

## 2014-03-18 MED ORDER — FERROUS SULFATE 325 (65 FE) MG PO TABS: 325.0000 mg | ORAL_TABLET | Freq: Two times a day (BID) | ORAL | Status: AC

## 2014-03-18 NOTE — Plan of Care (Signed)
Problem: Phase III Progression Outcomes Goal: Pain controlled on oral analgesia Outcome: Progressing     

## 2014-03-18 NOTE — Progress Notes (Signed)
Patient with orders to be discharged to [facility]. Report called to [place], nurse. Discharge packet with prescriptions sent with patient. Patient stable. Patient transported via EMS.

## 2014-03-18 NOTE — Discharge Summary (Signed)
. Physician Discharge Summary  Kendra [name] ZOX:[id number] DOB: [birth year] DOA: 03/14/2014  PCP: Alice ReichertMCINNIS,[name], MD  Admit date: 03/14/2014 Discharge date: 03/18/2014     Discharge Diagnoses:  Right t intertrochanteric fracture of hip 2 organic brain syndrome 3. Chronic C2 vertebral fracture 4. CK D stage III 5. Dementia     Discharge Condition: Stable Disposition: Discharged to skilled nursing facility  Diet recommendation: Regular  Filed Weights   03/16/14 0700 03/17/14 0540 03/18/14 0517  Weight: 54.885 kg (121 lb) 54.432 kg (120 lb) 54 kg (119 lb 0.8 oz)    History of present illness:  The patient had a presentation to ED with history of fall and nursing facility and following x-rays it was noted she had intertrochanteric fracture of right hip. This was stabilized and [facility]  Hospital Course:  Positive physical findings tenderness around the site of old cervical fracture and tenderness and decreased motion of the right hip joint. The patient was continued on medications listed below during her hospital stay. She was given hydrocodone 5 mg as needed for pain and remained in bed for the entire hospital stay  Orthopedist was consult on the case and he felt that she was not a surgical candidate but should be treated conservatively  .Marland Kitchen. She does have other problems CK D stage II which is stable and she did have significant anemia which appears to be iron deficient type anemia she did receive 2 units of packed RBCs  She has fractures C2 vertebral body and wears Aspen collar.  The patient is to continue medications listed below she'll be discharged to nursing facility    Discharge Instructions The patient is to continue medications listed below she'll be discharged to nursing facility and will remain at bedrest for approximately 1 month. After this period of time she should be seen by orthopedic specialist Dr. Romeo AppleHarrison. A repeat CBC should  be done in a week    Medication List    STOP taking these medications        simvastatin 20 MG tablet  Commonly known as:  ZOCOR      TAKE these medications        aspirin 325 MG tablet  Take 325 mg by mouth daily.     atorvastatin 10 MG tablet  Commonly known as:  LIPITOR  Take 1 tablet (10 mg total) by mouth daily at 6 PM.     bisacodyl 10 MG suppository  Commonly known as:  DULCOLAX  Place 1 suppository (10 mg total) rectally daily as needed for moderate constipation.     cloNIDine 0.1 MG tablet  Commonly known as:  CATAPRES  Take 1 tablet (0.1 mg total) by mouth 3 (three) times daily.     docusate sodium 100 MG capsule  Commonly known as:  COLACE  Take 100 mg by mouth at bedtime.     feeding supplement Liqd  Take 237 mLs by mouth daily as needed (meal replacement).     ferrous sulfate 325 (65 FE) MG tablet  Take 1 tablet (325 mg total) by mouth 2 (two) times daily with a meal.     HYDROcodone-acetaminophen 5-325 MG per tablet  Commonly known as:  NORCO/VICODIN  Take 1-2 tablets by mouth every 4 (four) hours as needed for moderate pain.     thioridazine 10 MG tablet  Commonly known as:  MELLARIL  Take 10 mg by mouth 2 (two) times daily.  No Known Allergies  The results of significant diagnostics from this hospitalization (including imaging, microbiology, ancillary and laboratory) are listed below for reference.    Significant Diagnostic Studies: Dg Hip Complete Right  03/14/2014   CLINICAL DATA:  Fall at nursing home with right hip pain.  EXAM: RIGHT HIP - COMPLETE 2+ VIEW  COMPARISON:  02/06/2014  FINDINGS: There is diffuse decreased bone mineralization. Intra medullary nail unchanged. There is a moderately displaced intertrochanteric fracture of the right femoral neck. There are mild symmetric degenerative changes of the hips. There are degenerative changes of the spine. Calcified plaque over the iliac and femoral arteries.  IMPRESSION: Moderately  displaced intertrochanteric fracture of the right femoral neck.   Electronically Signed   By: Elberta Fortis M.D.   On: 03/14/2014 12:08   Dg Chest Port 1 View  03/14/2014   CLINICAL DATA:  Weakness and renal failure.  L1 day prior  EXAM: PORTABLE CHEST - 1 VIEW  COMPARISON:  March 09, 2014  FINDINGS: There is a sizable hiatal hernia. There is no edema or consolidation. Heart is mildly prominent with pulmonary vascularity within normal limits. No adenopathy. No pneumothorax. There is thoracic dextroscoliosis. There is arthropathy in both shoulders with evidence of chronic rotator cuff tears.  IMPRESSION: No edema or consolidation. Sizable hiatal hernia. No apparent pneumothorax. Heart prominent but stable.   Electronically Signed   By: Bretta Bang M.D.   On: 03/14/2014 11:35   Dg Abd Acute W/chest  03/09/2014   CLINICAL DATA:  Urinary retention for 3 days.  EXAM: ACUTE ABDOMEN SERIES (ABDOMEN 2 VIEW & CHEST 1 VIEW)  COMPARISON:  Chest radiograph 02/06/2014. Lumbar spine radiographs 08/29/2013.  FINDINGS: The patient is rotated to the right, partially limiting evaluation. Cardiomediastinal silhouette is grossly unchanged allowing for rotation. Thoracic aortic calcification is noted. Hiatal hernia is noted. The lungs are hypoinflated with minimal bibasilar opacity, likely atelectasis. No definite pleural effusion or pneumothorax is identified.  No intraperitoneal free air is identified. There is a small to moderate amount of stool throughout the colon and rectum. Scattered small bowel gas is present, with minimal prominence of 1 or 2 loops of small bowel in the left upper abdomen but no definite bowel dilatation seen to suggest obstruction. The bones are osteopenic. Left femoral intramedullary nail is partially visualized. Vascular calcification is noted.  IMPRESSION: 1. Minimal bibasilar atelectasis. 2. Nonobstructed bowel-gas pattern.   Electronically Signed   By: Sebastian Ache   On: 03/09/2014 13:07     Microbiology: Recent Results (from the past 240 hour(s))  Urine culture     Status: None   Collection Time: 03/15/14 10:40 AM  Result Value Ref Range Status   Specimen Description URINE, CATHETERIZED  Final   Special Requests NONE  Final   Culture  Setup Time   Final    03/15/2014 21:59 Performed at [facility]    Colony Count NO GROWTH Performed at [facility]   Final   Culture NO GROWTH Performed at [facility]   Final   Report Status 03/17/2014 FINAL  Final     Labs: Basic Metabolic Panel:  Recent Labs Lab 03/14/14 1050 03/15/14 0619  NA 140 144  K 4.9 4.7  CL 109 117*  CO2 19 17*  GLUCOSE 116* 76  BUN 30* 27*  CREATININE 1.30* 1.39*  CALCIUM 8.1* 7.7*   Liver Function Tests:  Recent Labs Lab 03/14/14 1050 03/15/14 0619  AST 14 16  ALT 8 8  ALKPHOS 61 53  BILITOT 0.2* 0.3  PROT 5.7* 5.1*  ALBUMIN 2.6* 2.3*   No results for input(s): LIPASE, AMYLASE in the last 168 hours. No results for input(s): AMMONIA in the last 168 hours. CBC:  Recent Labs Lab 03/14/14 1050 03/15/14 0619 03/15/14 2001  WBC 4.4 4.2  --   NEUTROABS 3.5  --   --   HGB 7.2* 6.3* 10.4*  HCT 21.4* 19.2* 30.3*  MCV 88.4 89.3  --   PLT 104* 107*  --    Cardiac Enzymes:  Recent Labs Lab 03/14/14 1050 03/14/14 1735 03/15/14 0619  TROPONINI <0.30 <0.30 <0.30   BNP: BNP (last 3 results) No results for input(s): PROBNP in the last 8760 hours. CBG: No results for input(s): GLUCAP in the last 168 hours.  Active Problems:   Protein-calorie malnutrition, severe   Hypothermia   Organic brain syndrome   CKD (chronic kidney disease), stage III   Anemia   Dehydration   Femoral neck fracture   Hip fracture   Time coordinating discharge: 30 minutes Signed:  Butch PennyAngus Casidy [place], MD 03/18/2014, 7:47 AM

## 2014-03-20 NOTE — Progress Notes (Signed)
UR chart review completed.  

## 2014-04-06 ENCOUNTER — Telehealth: Payer: Self-pay | Admitting: Orthopedic Surgery

## 2014-04-06 NOTE — Telephone Encounter (Signed)
[facility], regarding neck brace - "being very soiled", which she came to their facility wearing - states she had initially been at another facility prior, [facility].  Patient is scheduled for a hospital follow up appointment 04/08/14.  Please advise as to possible brace replacement.  The ph# is [phone].

## 2014-04-07 NOTE — Telephone Encounter (Signed)
Routing to Dr Harrison 

## 2014-04-07 NOTE — Telephone Encounter (Signed)
I M NOT TAKING CARE OF THE NECK FRACTURE BUT.... IF THEY TELL ME THE BRACE NAME AND KIND ILL ORDER THEM A NEW ONE AND COMPANY IT CAME FROM

## 2014-04-10 NOTE — Telephone Encounter (Signed)
Nurse Tresa EndoKelly aware, states they washed it but will check for info for ordering new one

## 2014-04-18 ENCOUNTER — Encounter: Payer: Self-pay | Admitting: Orthopedic Surgery

## 2014-04-18 ENCOUNTER — Ambulatory Visit (INDEPENDENT_AMBULATORY_CARE_PROVIDER_SITE_OTHER): Payer: Self-pay | Admitting: Orthopedic Surgery

## 2014-04-18 VITALS — Ht 60.0 in | Wt 119.0 lb

## 2014-04-18 DIAGNOSIS — S72141D Displaced intertrochanteric fracture of right femur, subsequent encounter for closed fracture with routine healing: Secondary | ICD-10-CM

## 2014-04-18 NOTE — Patient Instructions (Signed)
Released.

## 2014-04-18 NOTE — Progress Notes (Signed)
Patient ID: Frutoso ChaseMattie E [name], female   DOB: [birth year], 80 y.o.   MRN: [id number]   Chief Complaint  Patient presents with  . Hip Injury    right hip fracture and cervical fx, DOI 03/14/14    Follow-up from a consultation done in the hospital for a 80 year old female who fell at hypothymia and had a hip fracture. She has organic brain syndrome chronic kidney disease stage III previous CVA dementia and it was decided that she would not have surgery on that right hip. She presents for follow-up. At this point there is no further management from orthopedic standpoint. I have amended frequent skin checks as these fractures can sometimes lead to open wound over the fracture site.  Skin was intact today. She had mild discomfort.  She should at least be mobilized bed to chair daily.  Follow-up as needed

## 2014-08-20 DEATH — deceased

## 2014-12-06 IMAGING — CR DG CHEST 1V PORT
1 series · 1 of 1 positions shown · non-contrast
Comparison: March 09, 2014

CLINICAL DATA: Weakness and renal failure.  L1 day prior

EXAM:
PORTABLE CHEST - 1 VIEW

[portable]
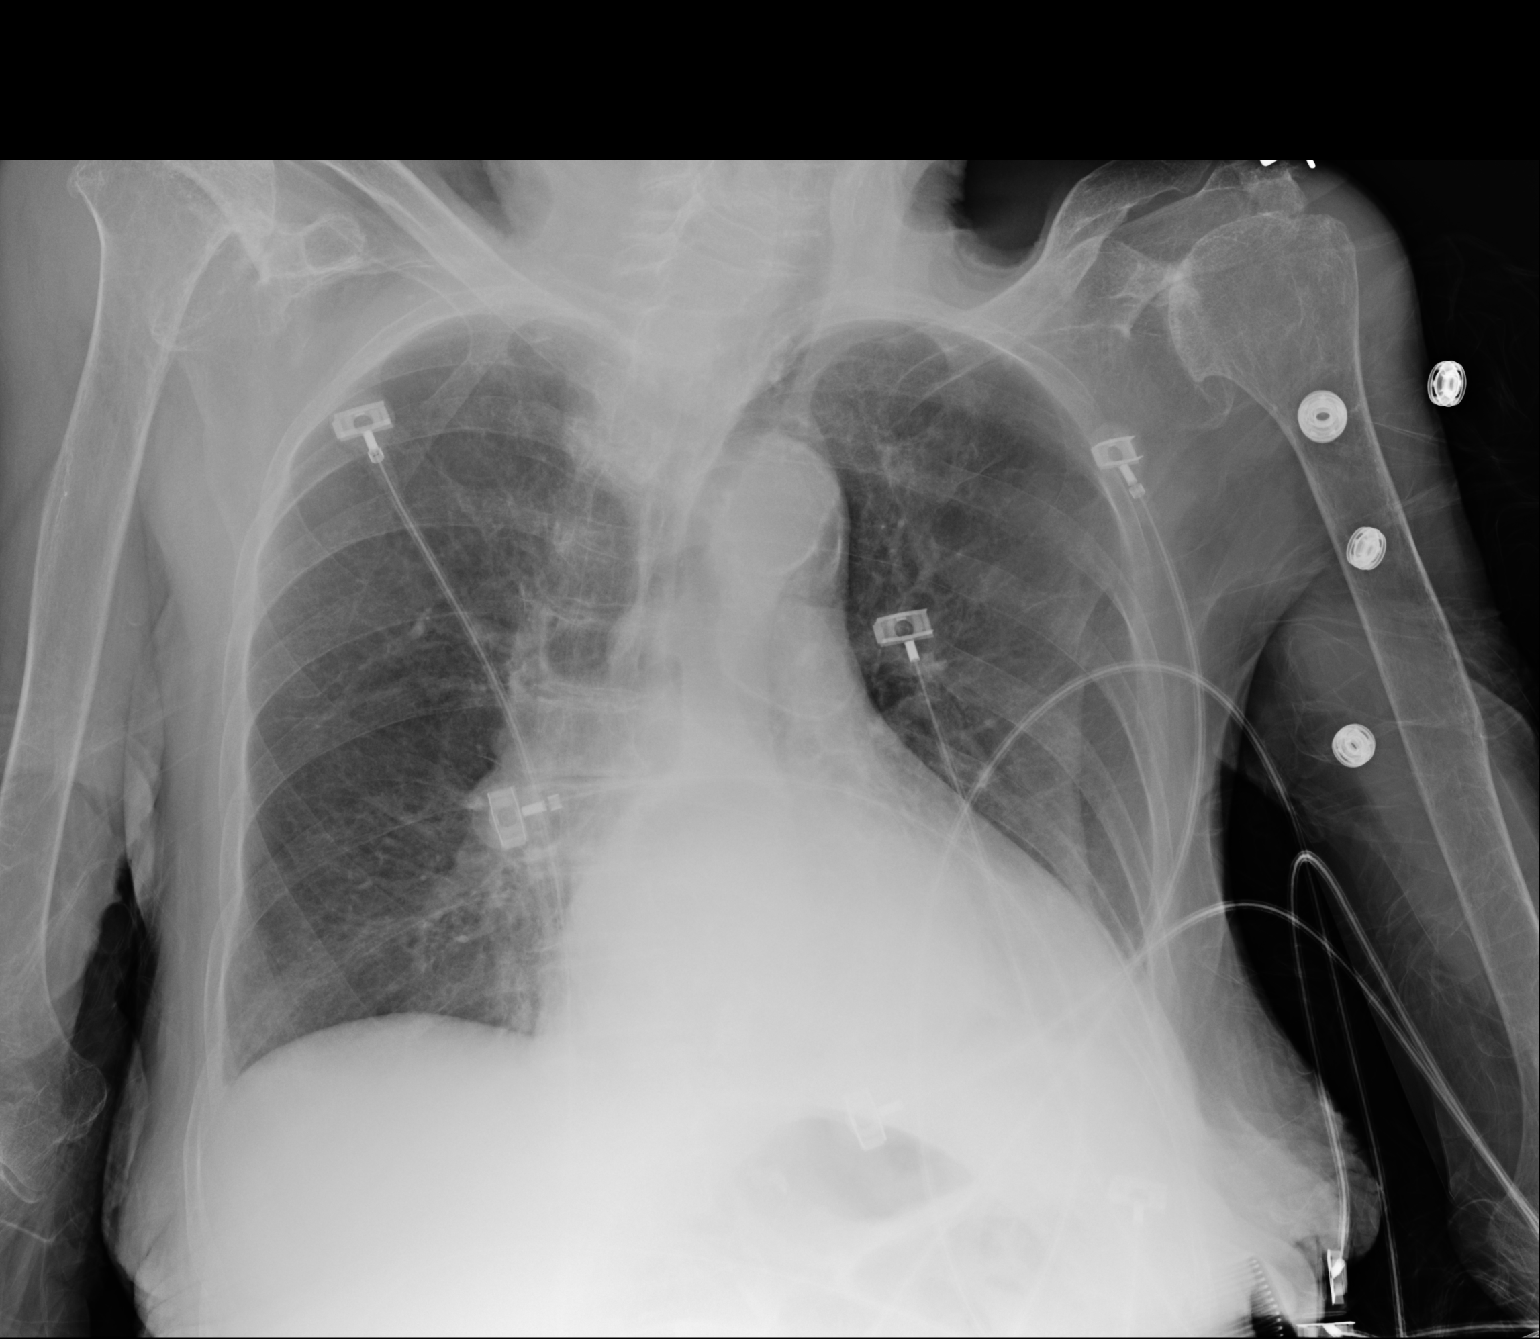

[1 of 1 positions shown; findings below may reference images not displayed]

FINDINGS: There is a sizable hiatal hernia. There is no edema or
consolidation. Heart is mildly prominent with pulmonary vascularity
within normal limits. No adenopathy. No pneumothorax. There is
thoracic dextroscoliosis. There is arthropathy in both shoulders
with evidence of chronic rotator cuff tears.
IMPRESSION: No edema or consolidation. Sizable hiatal hernia. No apparent
pneumothorax. Heart prominent but stable.
